# Patient Record
Sex: Male | Born: 1950 | Race: White | Hispanic: No | Marital: Married | Smoking: Former smoker
Health system: Southern US, Community
[De-identification: ages and names within clinical notes are randomized; demographics above are authoritative.]

## PROBLEM LIST (undated history)

## (undated) DIAGNOSIS — G20 Parkinson's disease: Secondary | ICD-10-CM

## (undated) DIAGNOSIS — K219 Gastro-esophageal reflux disease without esophagitis: Secondary | ICD-10-CM

## (undated) DIAGNOSIS — G4730 Sleep apnea, unspecified: Secondary | ICD-10-CM

## (undated) DIAGNOSIS — G4752 REM sleep behavior disorder: Secondary | ICD-10-CM

## (undated) DIAGNOSIS — F0390 Unspecified dementia without behavioral disturbance: Secondary | ICD-10-CM

## (undated) DIAGNOSIS — F22 Delusional disorders: Secondary | ICD-10-CM

## (undated) DIAGNOSIS — I10 Essential (primary) hypertension: Secondary | ICD-10-CM

## (undated) HISTORY — PX: KNEE ARTHROSCOPY: SUR90

---

## 1971-03-08 HISTORY — PX: MANDIBLE FRACTURE SURGERY: SHX706

## 1985-03-07 HISTORY — PX: KIDNEY STONE SURGERY: SHX686

## 1988-03-07 HISTORY — PX: BACK SURGERY: SHX140

## 1992-03-07 HISTORY — PX: CARDIAC CATHETERIZATION: SHX172

## 1992-03-07 HISTORY — PX: ELBOW SURGERY: SHX618

## 2011-03-08 DIAGNOSIS — G20 Parkinson's disease: Secondary | ICD-10-CM

## 2011-03-08 DIAGNOSIS — G20A1 Parkinson's disease without dyskinesia, without mention of fluctuations: Secondary | ICD-10-CM

## 2011-03-08 HISTORY — DX: Parkinson's disease: G20

## 2011-03-08 HISTORY — DX: Parkinson's disease without dyskinesia, without mention of fluctuations: G20.A1

## 2013-12-18 ENCOUNTER — Other Ambulatory Visit: Payer: Self-pay | Admitting: Neurosurgery

## 2013-12-18 DIAGNOSIS — M47817 Spondylosis without myelopathy or radiculopathy, lumbosacral region: Secondary | ICD-10-CM

## 2013-12-23 ENCOUNTER — Ambulatory Visit
Admission: RE | Admit: 2013-12-23 | Discharge: 2013-12-23 | Disposition: A | Discharge status: Home / Self Care | Payer: Managed Care, Other (non HMO) | Source: Ambulatory Visit | Attending: Neurosurgery | Admitting: Neurosurgery

## 2013-12-23 DIAGNOSIS — M47817 Spondylosis without myelopathy or radiculopathy, lumbosacral region: Secondary | ICD-10-CM

## 2013-12-23 MED ORDER — IOHEXOL 180 MG/ML  SOLN
15.0000 mL | Freq: Once | INTRAMUSCULAR | Status: AC | PRN
  Administered 2013-12-23: 15 mL via INTRATHECAL

## 2013-12-23 MED ORDER — DIAZEPAM 5 MG PO TABS
10.0000 mg | ORAL_TABLET | Freq: Once | ORAL | Status: AC
  Administered 2013-12-23: 10 mg via ORAL

## 2013-12-23 NOTE — Discharge Instructions (Signed)

## 2013-12-26 ENCOUNTER — Other Ambulatory Visit: Payer: Self-pay | Admitting: Neurosurgery

## 2013-12-26 ENCOUNTER — Encounter (HOSPITAL_COMMUNITY): Payer: Self-pay | Admitting: Pharmacy Technician

## 2014-01-02 ENCOUNTER — Encounter (HOSPITAL_COMMUNITY)
Admission: RE | Admit: 2014-01-02 | Discharge: 2014-01-02 | Disposition: A | Discharge status: Home / Self Care | Payer: Managed Care, Other (non HMO) | Source: Ambulatory Visit | Attending: Neurosurgery | Admitting: Neurosurgery

## 2014-01-02 ENCOUNTER — Encounter (HOSPITAL_COMMUNITY): Payer: Self-pay

## 2014-01-02 DIAGNOSIS — Z01812 Encounter for preprocedural laboratory examination: Secondary | ICD-10-CM | POA: Diagnosis present

## 2014-01-02 HISTORY — DX: Sleep apnea, unspecified: G47.30

## 2014-01-02 HISTORY — DX: Parkinson's disease: G20

## 2014-01-02 HISTORY — DX: Gastro-esophageal reflux disease without esophagitis: K21.9

## 2014-01-02 LAB — CBC WITH DIFFERENTIAL/PLATELET
BASOS ABS: 0 10*3/uL (ref 0.0–0.1)
BASOS PCT: 0 % (ref 0–1)
EOS PCT: 2 % (ref 0–5)
Eosinophils Absolute: 0.1 10*3/uL (ref 0.0–0.7)
HEMATOCRIT: 47 % (ref 39.0–52.0)
Hemoglobin: 16.1 g/dL (ref 13.0–17.0)
Lymphocytes Relative: 20 % (ref 12–46)
Lymphs Abs: 1.8 10*3/uL (ref 0.7–4.0)
MCH: 31.3 pg (ref 26.0–34.0)
MCHC: 34.3 g/dL (ref 30.0–36.0)
MCV: 91.4 fL (ref 78.0–100.0)
Monocytes Absolute: 0.8 10*3/uL (ref 0.1–1.0)
Monocytes Relative: 9 % (ref 3–12)
NEUTROS ABS: 6.3 10*3/uL (ref 1.7–7.7)
Neutrophils Relative %: 69 % (ref 43–77)
PLATELETS: 219 10*3/uL (ref 150–400)
RBC: 5.14 MIL/uL (ref 4.22–5.81)
RDW: 12.8 % (ref 11.5–15.5)
WBC: 9.1 10*3/uL (ref 4.0–10.5)

## 2014-01-02 LAB — BASIC METABOLIC PANEL
Anion gap: 14 (ref 5–15)
BUN: 11 mg/dL (ref 6–23)
CALCIUM: 9.2 mg/dL (ref 8.4–10.5)
CO2: 26 mEq/L (ref 19–32)
CREATININE: 0.7 mg/dL (ref 0.50–1.35)
Chloride: 102 mEq/L (ref 96–112)
GFR calc Af Amer: >90 mL/min (ref >90)
GFR calc non Af Amer: >90 mL/min (ref >90)
GLUCOSE: 95 mg/dL (ref 70–99)
Potassium: 4.8 mEq/L (ref 3.7–5.3)
Sodium: 142 mEq/L (ref 137–147)

## 2014-01-02 LAB — TYPE AND SCREEN
ABO/RH(D): O POS
Antibody Screen: NEGATIVE

## 2014-01-02 LAB — SURGICAL PCR SCREEN
MRSA, PCR: NEGATIVE
STAPHYLOCOCCUS AUREUS: NEGATIVE

## 2014-01-02 LAB — ABO/RH: ABO/RH(D): O POS

## 2014-01-02 NOTE — Pre-Procedure Instructions (Signed)
Tyler NatalRoger Mullet  01/02/2014   Your procedure is scheduled on:  Tuesday, Nov. 3  [facility]A" at 5:30 AM.  Call this number if you have problems the morning of surgery: [phone]                             Call this number if you have any questions prior to the surgery date: pre-admission [phone]   Remember:   Do not eat food or drink liquids after midnight.   Take these medicines the morning of surgery with A SIP OF WATER: carbidopa-levodopa, zyrtec, clonazepam,    Do not wear jewelry, make-up or nail polish.  Do not wear lotions, powders, or perfumes. You may wear deodorant.  Do not shave 48 hours prior to surgery. Men may shave face and neck.  Do not bring valuables to the hospital.  [facility] is not responsible                  for any belongings or valuables.               Contacts, dentures or bridgework may not be worn into surgery.  Leave suitcase in the car. After surgery it may be brought to your room.  For patients admitted to the hospital, discharge time is determined by your                treatment team.               Patients discharged the day of surgery will not be allowed to drive  home.  Name and phone number of your driver:   Special Instructions: review preparing for surgery handout   Please read over the following fact sheets that you were given: Pain Booklet, Coughing and Deep Breathing, Blood Transfusion Information, MRSA Information and Surgical Site Infection Prevention

## 2014-01-02 NOTE — Progress Notes (Signed)
PCP: Dr[facility] Adon[facility]Michael Waters [facility] in [place] Dr. Kern ReapKimberly Bird in [place] - sleep apnea  No cardiologist. States 1994 [facility] for "chest pain" states it was normal and no problems since then. Doesn't remember who refered him. Pt. Did state he saw Dr. Cindi CarbonZachary Bosh in [place] (cardiologist) and may had a stress test 4 yrs. Ago doesn't remember why and hasn't seen him since that time. Will request these records.

## 2014-01-06 MED ORDER — CEFAZOLIN SODIUM-DEXTROSE 2-3 GM-% IV SOLR
2.0000 g | INTRAVENOUS | Status: AC
  Administered 2014-01-07: 2 g via INTRAVENOUS
  Filled 2014-01-06: qty 50

## 2014-01-06 MED ORDER — DEXAMETHASONE SODIUM PHOSPHATE 10 MG/ML IJ SOLN
10.0000 mg | INTRAMUSCULAR | Status: AC
  Administered 2014-01-07: 10 mg via INTRAVENOUS
  Filled 2014-01-06: qty 1

## 2014-01-06 NOTE — Anesthesia Preprocedure Evaluation (Addendum)
Anesthesia Evaluation  Patient identified by MRN, date of birth, ID band Patient awake    Reviewed: Allergy & Precautions, H&P , NPO status , Patient's Chart, lab work & pertinent test results  Airway Mallampati: III   Neck ROM: Full    Dental  (+) Teeth Intact   Pulmonary sleep apnea , former smoker,          Cardiovascular Rhythm:Regular     Neuro/Psych    GI/Hepatic GERD-  ,  Endo/Other    Renal/GU      Musculoskeletal   Abdominal (+)  Abdomen: soft.    Peds  Hematology   Anesthesia Other Findings   Reproductive/Obstetrics                            Anesthesia Physical Anesthesia Plan  ASA: III  Anesthesia Plan: General   Post-op Pain Management:    Induction: Intravenous  Airway Management Planned: Oral ETT  Additional Equipment:   Intra-op Plan:   Post-operative Plan: Extubation in OR  Informed Consent: I have reviewed the patients History and Physical, chart, labs and discussed the procedure including the risks, benefits and alternatives for the proposed anesthesia with the patient or authorized representative who has indicated his/her understanding and acceptance.     Plan Discussed with:   Anesthesia Plan Comments:         Anesthesia Quick Evaluation

## 2014-01-07 ENCOUNTER — Inpatient Hospital Stay (HOSPITAL_COMMUNITY)
Admission: RE | Admit: 2014-01-07 | Discharge: 2014-01-08 | DRG: 460 | Disposition: A | Discharge status: Home / Self Care | Payer: Managed Care, Other (non HMO) | Source: Ambulatory Visit | Attending: Neurosurgery | Admitting: Neurosurgery

## 2014-01-07 ENCOUNTER — Encounter (HOSPITAL_COMMUNITY): Payer: Self-pay | Admitting: *Deleted

## 2014-01-07 ENCOUNTER — Inpatient Hospital Stay (HOSPITAL_COMMUNITY): Payer: Managed Care, Other (non HMO) | Admitting: Anesthesiology

## 2014-01-07 ENCOUNTER — Inpatient Hospital Stay (HOSPITAL_COMMUNITY): Payer: Managed Care, Other (non HMO)

## 2014-01-07 ENCOUNTER — Encounter (HOSPITAL_COMMUNITY)
Admission: RE | Disposition: A | Discharge status: Home / Self Care | Payer: Managed Care, Other (non HMO) | Source: Ambulatory Visit | Attending: Neurosurgery

## 2014-01-07 DIAGNOSIS — M4317 Spondylolisthesis, lumbosacral region: Secondary | ICD-10-CM | POA: Diagnosis present

## 2014-01-07 DIAGNOSIS — F17220 Nicotine dependence, chewing tobacco, uncomplicated: Secondary | ICD-10-CM | POA: Diagnosis present

## 2014-01-07 DIAGNOSIS — M4310 Spondylolisthesis, site unspecified: Secondary | ICD-10-CM

## 2014-01-07 DIAGNOSIS — G4752 REM sleep behavior disorder: Secondary | ICD-10-CM | POA: Diagnosis present

## 2014-01-07 DIAGNOSIS — K219 Gastro-esophageal reflux disease without esophagitis: Secondary | ICD-10-CM | POA: Diagnosis present

## 2014-01-07 DIAGNOSIS — G20 Parkinson's disease: Secondary | ICD-10-CM | POA: Diagnosis present

## 2014-01-07 DIAGNOSIS — M79604 Pain in right leg: Secondary | ICD-10-CM | POA: Diagnosis present

## 2014-01-07 DIAGNOSIS — G4730 Sleep apnea, unspecified: Secondary | ICD-10-CM | POA: Diagnosis present

## 2014-01-07 DIAGNOSIS — Z7952 Long term (current) use of systemic steroids: Secondary | ICD-10-CM | POA: Diagnosis not present

## 2014-01-07 HISTORY — DX: REM sleep behavior disorder: G47.52

## 2014-01-07 SURGERY — POSTERIOR LUMBAR FUSION 1 LEVEL
Anesthesia: General | Site: Back

## 2014-01-07 MED ORDER — OXYCODONE-ACETAMINOPHEN 5-325 MG PO TABS
1.0000 | ORAL_TABLET | ORAL | Status: DC | PRN
  Administered 2014-01-07 – 2014-01-08 (×5): 2 via ORAL
  Filled 2014-01-07 (×4): qty 2

## 2014-01-07 MED ORDER — ROCURONIUM BROMIDE 50 MG/5ML IV SOLN
INTRAVENOUS | Status: AC
  Filled 2014-01-07: qty 1

## 2014-01-07 MED ORDER — CARBIDOPA-LEVODOPA 25-100 MG PO TABS
2.0000 | ORAL_TABLET | Freq: Three times a day (TID) | ORAL | Status: DC
  Administered 2014-01-07: 2 via ORAL
  Filled 2014-01-07 (×5): qty 2

## 2014-01-07 MED ORDER — HYDROMORPHONE HCL 1 MG/ML IJ SOLN
0.5000 mg | INTRAMUSCULAR | Status: DC | PRN
  Administered 2014-01-07: 1 mg via INTRAVENOUS
  Filled 2014-01-07: qty 1

## 2014-01-07 MED ORDER — ALUM & MAG HYDROXIDE-SIMETH 200-200-20 MG/5ML PO SUSP: 30.0000 mL | Freq: Four times a day (QID) | ORAL | Status: DC | PRN

## 2014-01-07 MED ORDER — LINACLOTIDE 145 MCG PO CAPS
145.0000 ug | ORAL_CAPSULE | Freq: Every day | ORAL | Status: DC
  Filled 2014-01-07 (×2): qty 1

## 2014-01-07 MED ORDER — MENTHOL 3 MG MT LOZG: 1.0000 | LOZENGE | OROMUCOSAL | Status: DC | PRN

## 2014-01-07 MED ORDER — BISACODYL 10 MG RE SUPP: 10.0000 mg | Freq: Every day | RECTAL | Status: DC | PRN

## 2014-01-07 MED ORDER — ONDANSETRON HCL 4 MG/2ML IJ SOLN: 4.0000 mg | INTRAMUSCULAR | Status: DC | PRN

## 2014-01-07 MED ORDER — GLYCOPYRROLATE 0.2 MG/ML IJ SOLN
INTRAMUSCULAR | Status: AC
  Filled 2014-01-07: qty 3

## 2014-01-07 MED ORDER — PROMETHAZINE HCL 25 MG/ML IJ SOLN: 6.2500 mg | INTRAMUSCULAR | Status: DC | PRN

## 2014-01-07 MED ORDER — SUCCINYLCHOLINE CHLORIDE 20 MG/ML IJ SOLN
INTRAMUSCULAR | Status: AC
  Filled 2014-01-07: qty 1

## 2014-01-07 MED ORDER — ACETAMINOPHEN 650 MG RE SUPP: 650.0000 mg | RECTAL | Status: DC | PRN

## 2014-01-07 MED ORDER — DEXMEDETOMIDINE HCL 200 MCG/2ML IV SOLN
INTRAVENOUS | Status: DC | PRN
  Administered 2014-01-07: 4 ug via INTRAVENOUS
  Administered 2014-01-07 (×2): 8 ug via INTRAVENOUS

## 2014-01-07 MED ORDER — CLONAZEPAM 0.5 MG PO TABS
0.5000 mg | ORAL_TABLET | Freq: Two times a day (BID) | ORAL | Status: DC
  Administered 2014-01-07 (×2): 0.5 mg via ORAL
  Filled 2014-01-07 (×2): qty 1

## 2014-01-07 MED ORDER — SODIUM CHLORIDE 0.9 % IR SOLN
Status: DC | PRN
  Administered 2014-01-07: 08:00:00

## 2014-01-07 MED ORDER — OXYCODONE-ACETAMINOPHEN 5-325 MG PO TABS
ORAL_TABLET | ORAL | Status: AC
  Filled 2014-01-07: qty 2

## 2014-01-07 MED ORDER — FENTANYL CITRATE 0.05 MG/ML IJ SOLN
25.0000 ug | INTRAMUSCULAR | Status: DC | PRN
  Administered 2014-01-07 (×3): 50 ug via INTRAVENOUS

## 2014-01-07 MED ORDER — FENTANYL CITRATE 0.05 MG/ML IJ SOLN
INTRAMUSCULAR | Status: AC
  Filled 2014-01-07: qty 2

## 2014-01-07 MED ORDER — DEXTROSE 5 % IV SOLN
10.0000 mg | INTRAVENOUS | Status: DC | PRN
  Administered 2014-01-07: 20 ug/min via INTRAVENOUS

## 2014-01-07 MED ORDER — ARTIFICIAL TEARS OP OINT
TOPICAL_OINTMENT | OPHTHALMIC | Status: DC | PRN
  Administered 2014-01-07: 1 via OPHTHALMIC

## 2014-01-07 MED ORDER — NEOSTIGMINE METHYLSULFATE 10 MG/10ML IV SOLN
INTRAVENOUS | Status: DC | PRN
  Administered 2014-01-07: 4 mg via INTRAVENOUS

## 2014-01-07 MED ORDER — VANCOMYCIN HCL 1000 MG IV SOLR
INTRAVENOUS | Status: AC
  Filled 2014-01-07: qty 1000

## 2014-01-07 MED ORDER — ARTIFICIAL TEARS OP OINT
TOPICAL_OINTMENT | OPHTHALMIC | Status: AC
  Filled 2014-01-07: qty 3.5

## 2014-01-07 MED ORDER — PHENYLEPHRINE HCL 10 MG/ML IJ SOLN
INTRAMUSCULAR | Status: DC | PRN
  Administered 2014-01-07 (×2): 80 ug via INTRAVENOUS

## 2014-01-07 MED ORDER — EPHEDRINE SULFATE 50 MG/ML IJ SOLN
INTRAMUSCULAR | Status: AC
  Filled 2014-01-07: qty 1

## 2014-01-07 MED ORDER — BUPIVACAINE HCL (PF) 0.25 % IJ SOLN
INTRAMUSCULAR | Status: DC | PRN
  Administered 2014-01-07: 20 mL

## 2014-01-07 MED ORDER — CYCLOBENZAPRINE HCL 10 MG PO TABS
ORAL_TABLET | ORAL | Status: AC
  Filled 2014-01-07: qty 1

## 2014-01-07 MED ORDER — MIDAZOLAM HCL 5 MG/5ML IJ SOLN
INTRAMUSCULAR | Status: DC | PRN
  Administered 2014-01-07: 2 mg via INTRAVENOUS

## 2014-01-07 MED ORDER — LORATADINE 10 MG PO TABS
10.0000 mg | ORAL_TABLET | Freq: Every day | ORAL | Status: DC
  Administered 2014-01-08: 10 mg via ORAL
  Filled 2014-01-07: qty 1

## 2014-01-07 MED ORDER — SODIUM CHLORIDE 0.9 % IJ SOLN
INTRAMUSCULAR | Status: AC
  Filled 2014-01-07: qty 10

## 2014-01-07 MED ORDER — 0.9 % SODIUM CHLORIDE (POUR BTL) OPTIME
TOPICAL | Status: DC | PRN
  Administered 2014-01-07: 1000 mL

## 2014-01-07 MED ORDER — THROMBIN 20000 UNITS EX SOLR
CUTANEOUS | Status: DC | PRN
  Administered 2014-01-07: 08:00:00 via TOPICAL

## 2014-01-07 MED ORDER — ROCURONIUM BROMIDE 100 MG/10ML IV SOLN
INTRAVENOUS | Status: DC | PRN
  Administered 2014-01-07: 10 mg via INTRAVENOUS
  Administered 2014-01-07: 50 mg via INTRAVENOUS

## 2014-01-07 MED ORDER — PANTOPRAZOLE SODIUM 40 MG PO TBEC
40.0000 mg | DELAYED_RELEASE_TABLET | Freq: Every day | ORAL | Status: DC
  Administered 2014-01-07: 40 mg via ORAL
  Filled 2014-01-07 (×2): qty 1

## 2014-01-07 MED ORDER — LIDOCAINE HCL (CARDIAC) 20 MG/ML IV SOLN
INTRAVENOUS | Status: DC | PRN
  Administered 2014-01-07: 80 mg via INTRAVENOUS

## 2014-01-07 MED ORDER — NEOSTIGMINE METHYLSULFATE 10 MG/10ML IV SOLN
INTRAVENOUS | Status: AC
  Filled 2014-01-07: qty 1

## 2014-01-07 MED ORDER — PROPOFOL 10 MG/ML IV BOLUS
INTRAVENOUS | Status: AC
  Filled 2014-01-07: qty 20

## 2014-01-07 MED ORDER — ONDANSETRON HCL 4 MG/2ML IJ SOLN
INTRAMUSCULAR | Status: DC | PRN
  Administered 2014-01-07: 4 mg via INTRAVENOUS

## 2014-01-07 MED ORDER — ONDANSETRON HCL 4 MG/2ML IJ SOLN
INTRAMUSCULAR | Status: AC
  Filled 2014-01-07: qty 2

## 2014-01-07 MED ORDER — MEPERIDINE HCL 25 MG/ML IJ SOLN: 6.2500 mg | INTRAMUSCULAR | Status: DC | PRN

## 2014-01-07 MED ORDER — GLYCOPYRROLATE 0.2 MG/ML IJ SOLN
INTRAMUSCULAR | Status: DC | PRN
  Administered 2014-01-07: 0.6 mg via INTRAVENOUS

## 2014-01-07 MED ORDER — FENTANYL CITRATE 0.05 MG/ML IJ SOLN
INTRAMUSCULAR | Status: AC
  Filled 2014-01-07: qty 5

## 2014-01-07 MED ORDER — PROPOFOL 10 MG/ML IV BOLUS
INTRAVENOUS | Status: DC | PRN
  Administered 2014-01-07: 160 mg via INTRAVENOUS

## 2014-01-07 MED ORDER — CYCLOBENZAPRINE HCL 10 MG PO TABS
10.0000 mg | ORAL_TABLET | Freq: Three times a day (TID) | ORAL | Status: DC | PRN
  Administered 2014-01-07: 10 mg via ORAL

## 2014-01-07 MED ORDER — VANCOMYCIN HCL 1000 MG IV SOLR
INTRAVENOUS | Status: DC | PRN
  Administered 2014-01-07: 1000 mg

## 2014-01-07 MED ORDER — HYDROCODONE-ACETAMINOPHEN 5-325 MG PO TABS: 1.0000 | ORAL_TABLET | ORAL | Status: DC | PRN

## 2014-01-07 MED ORDER — PHENYLEPHRINE 40 MCG/ML (10ML) SYRINGE FOR IV PUSH (FOR BLOOD PRESSURE SUPPORT)
PREFILLED_SYRINGE | INTRAVENOUS | Status: AC
  Filled 2014-01-07: qty 10

## 2014-01-07 MED ORDER — FLEET ENEMA 7-19 GM/118ML RE ENEM: 1.0000 | ENEMA | Freq: Once | RECTAL | Status: AC | PRN

## 2014-01-07 MED ORDER — FENTANYL CITRATE 0.05 MG/ML IJ SOLN
INTRAMUSCULAR | Status: DC | PRN
  Administered 2014-01-07 (×2): 50 ug via INTRAVENOUS
  Administered 2014-01-07: 100 ug via INTRAVENOUS

## 2014-01-07 MED ORDER — PHENOL 1.4 % MT LIQD: 1.0000 | OROMUCOSAL | Status: DC | PRN

## 2014-01-07 MED ORDER — SODIUM CHLORIDE 0.9 % IJ SOLN: 3.0000 mL | INTRAMUSCULAR | Status: DC | PRN

## 2014-01-07 MED ORDER — CELECOXIB 200 MG PO CAPS
200.0000 mg | ORAL_CAPSULE | Freq: Every day | ORAL | Status: DC
  Administered 2014-01-07 – 2014-01-08 (×2): 200 mg via ORAL
  Filled 2014-01-07 (×2): qty 1

## 2014-01-07 MED ORDER — CEFAZOLIN SODIUM 1-5 GM-% IV SOLN
1.0000 g | Freq: Three times a day (TID) | INTRAVENOUS | Status: AC
  Administered 2014-01-07 (×2): 1 g via INTRAVENOUS
  Filled 2014-01-07 (×2): qty 50

## 2014-01-07 MED ORDER — MIDAZOLAM HCL 2 MG/2ML IJ SOLN
INTRAMUSCULAR | Status: AC
  Filled 2014-01-07: qty 2

## 2014-01-07 MED ORDER — SENNA 8.6 MG PO TABS
1.0000 | ORAL_TABLET | Freq: Two times a day (BID) | ORAL | Status: DC
  Administered 2014-01-07 (×2): 8.6 mg via ORAL
  Filled 2014-01-07 (×4): qty 1

## 2014-01-07 MED ORDER — ACETAMINOPHEN 325 MG PO TABS
650.0000 mg | ORAL_TABLET | ORAL | Status: DC | PRN
Start: 2014-01-07 — End: 2014-01-08

## 2014-01-07 MED ORDER — SODIUM CHLORIDE 0.9 % IJ SOLN
3.0000 mL | Freq: Two times a day (BID) | INTRAMUSCULAR | Status: DC
  Administered 2014-01-07: 3 mL via INTRAVENOUS

## 2014-01-07 MED ORDER — PREDNISONE 2.5 MG PO TABS
2.5000 mg | ORAL_TABLET | Freq: Every day | ORAL | Status: DC
Start: 2014-01-08 — End: 2014-01-08
  Administered 2014-01-08: 2.5 mg via ORAL
  Filled 2014-01-07 (×2): qty 1

## 2014-01-07 MED ORDER — SUCCINYLCHOLINE CHLORIDE 20 MG/ML IJ SOLN
INTRAMUSCULAR | Status: DC | PRN
  Administered 2014-01-07: 140 mg via INTRAVENOUS

## 2014-01-07 MED ORDER — SODIUM CHLORIDE 0.9 % IV SOLN: 250.0000 mL | INTRAVENOUS | Status: DC

## 2014-01-07 MED ORDER — POLYETHYLENE GLYCOL 3350 17 G PO PACK: 17.0000 g | PACK | Freq: Every day | ORAL | Status: DC | PRN

## 2014-01-07 MED ORDER — LIDOCAINE HCL (CARDIAC) 20 MG/ML IV SOLN
INTRAVENOUS | Status: AC
  Filled 2014-01-07: qty 5

## 2014-01-07 MED ORDER — LACTATED RINGERS IV SOLN
INTRAVENOUS | Status: DC | PRN
  Administered 2014-01-07 (×2): via INTRAVENOUS

## 2014-01-07 MED FILL — Heparin Sodium (Porcine) Inj 1000 Unit/ML: INTRAMUSCULAR | Qty: 30 | Status: AC

## 2014-01-07 MED FILL — Sodium Chloride IV Soln 0.9%: INTRAVENOUS | Qty: 1000 | Status: AC

## 2014-01-07 SURGICAL SUPPLY — 64 items
BAG DECANTER FOR FLEXI CONT (MISCELLANEOUS) ×2 IMPLANT
BENZOIN TINCTURE PRP APPL 2/3 (GAUZE/BANDAGES/DRESSINGS) ×2 IMPLANT
BLADE CLIPPER SURG (BLADE) IMPLANT
BRUSH SCRUB EZ PLAIN DRY (MISCELLANEOUS) ×2 IMPLANT
BUR MATCHSTICK NEURO 3.0 LAGG (BURR) ×2 IMPLANT
CAGE CAPSTONE 10X22X6 (Cage) ×4 IMPLANT
CANISTER SUCT 3000ML (MISCELLANEOUS) ×2 IMPLANT
CAP LCK SPNE (Orthopedic Implant) ×4 IMPLANT
CAP LOCK SPINE RADIUS (Orthopedic Implant) ×4 IMPLANT
CAP LOCKING (Orthopedic Implant) ×4 IMPLANT
CONT SPEC 4OZ CLIKSEAL STRL BL (MISCELLANEOUS) ×4 IMPLANT
COVER BACK TABLE 60X90IN (DRAPES) ×2 IMPLANT
DECANTER SPIKE VIAL GLASS SM (MISCELLANEOUS) ×2 IMPLANT
DERMABOND ADHESIVE PROPEN (GAUZE/BANDAGES/DRESSINGS) ×1
DERMABOND ADVANCED .7 DNX6 (GAUZE/BANDAGES/DRESSINGS) ×1 IMPLANT
DRAPE C-ARM 42X72 X-RAY (DRAPES) ×4 IMPLANT
DRAPE LAPAROTOMY 100X72X124 (DRAPES) ×2 IMPLANT
DRAPE POUCH INSTRU U-SHP 10X18 (DRAPES) ×2 IMPLANT
DRAPE PROXIMA HALF (DRAPES) IMPLANT
DRAPE SURG 17X23 STRL (DRAPES) ×8 IMPLANT
DRSG OPSITE POSTOP 4X6 (GAUZE/BANDAGES/DRESSINGS) ×2 IMPLANT
DURAPREP 26ML APPLICATOR (WOUND CARE) ×2 IMPLANT
ELECT REM PT RETURN 9FT ADLT (ELECTROSURGICAL) ×2
ELECTRODE REM PT RTRN 9FT ADLT (ELECTROSURGICAL) ×1 IMPLANT
EVACUATOR 1/8 PVC DRAIN (DRAIN) ×2 IMPLANT
GAUZE SPONGE 4X4 12PLY STRL (GAUZE/BANDAGES/DRESSINGS) ×2 IMPLANT
GAUZE SPONGE 4X4 16PLY XRAY LF (GAUZE/BANDAGES/DRESSINGS) IMPLANT
GLOVE BIOGEL PI IND STRL 7.0 (GLOVE) ×2 IMPLANT
GLOVE BIOGEL PI IND STRL 7.5 (GLOVE) ×1 IMPLANT
GLOVE BIOGEL PI INDICATOR 7.0 (GLOVE) ×2
GLOVE BIOGEL PI INDICATOR 7.5 (GLOVE) ×1
GLOVE ECLIPSE 7.0 STRL STRAW (GLOVE) ×2 IMPLANT
GLOVE ECLIPSE 9.0 STRL (GLOVE) ×4 IMPLANT
GLOVE EXAM NITRILE LRG STRL (GLOVE) IMPLANT
GLOVE EXAM NITRILE MD LF STRL (GLOVE) IMPLANT
GLOVE EXAM NITRILE XL STR (GLOVE) IMPLANT
GLOVE EXAM NITRILE XS STR PU (GLOVE) IMPLANT
GLOVE OPTIFIT SS 6.5 STRL BRWN (GLOVE) ×8 IMPLANT
GOWN STRL REUS W/ TWL LRG LVL3 (GOWN DISPOSABLE) ×2 IMPLANT
GOWN STRL REUS W/ TWL XL LVL3 (GOWN DISPOSABLE) ×2 IMPLANT
GOWN STRL REUS W/TWL 2XL LVL3 (GOWN DISPOSABLE) IMPLANT
GOWN STRL REUS W/TWL LRG LVL3 (GOWN DISPOSABLE) ×2
GOWN STRL REUS W/TWL XL LVL3 (GOWN DISPOSABLE) ×2
KIT BASIN OR (CUSTOM PROCEDURE TRAY) ×2 IMPLANT
KIT ROOM TURNOVER OR (KITS) ×2 IMPLANT
LIQUID BAND (GAUZE/BANDAGES/DRESSINGS) ×2 IMPLANT
NEEDLE HYPO 22GX1.5 SAFETY (NEEDLE) ×2 IMPLANT
NS IRRIG 1000ML POUR BTL (IV SOLUTION) ×2 IMPLANT
PACK LAMINECTOMY NEURO (CUSTOM PROCEDURE TRAY) ×2 IMPLANT
ROD RADIUS 35MM (Rod) ×4 IMPLANT
SCREW 6.75X40MM (Screw) ×8 IMPLANT
SPONGE SURGIFOAM ABS GEL 100 (HEMOSTASIS) ×2 IMPLANT
STRIP CLOSURE SKIN 1/2X4 (GAUZE/BANDAGES/DRESSINGS) ×4 IMPLANT
SUT VIC AB 0 CT1 18XCR BRD8 (SUTURE) ×2 IMPLANT
SUT VIC AB 0 CT1 8-18 (SUTURE) ×2
SUT VIC AB 2-0 CT1 18 (SUTURE) ×2 IMPLANT
SUT VIC AB 3-0 SH 8-18 (SUTURE) ×4 IMPLANT
SYR 20ML ECCENTRIC (SYRINGE) ×2 IMPLANT
TAPE CLOTH SURG 4X10 WHT LF (GAUZE/BANDAGES/DRESSINGS) ×2 IMPLANT
TAPE STRIPS DRAPE STRL (GAUZE/BANDAGES/DRESSINGS) ×2 IMPLANT
TOWEL OR 17X24 6PK STRL BLUE (TOWEL DISPOSABLE) ×2 IMPLANT
TOWEL OR 17X26 10 PK STRL BLUE (TOWEL DISPOSABLE) ×2 IMPLANT
TRAY FOLEY CATH 14FRSI W/METER (CATHETERS) ×2 IMPLANT
WATER STERILE IRR 1000ML POUR (IV SOLUTION) ×2 IMPLANT

## 2014-01-07 NOTE — Progress Notes (Signed)
Utilization review completed.  

## 2014-01-07 NOTE — H&P (Signed)
Tyler Fitzpatrick is an 63 y.o. male.   Chief Complaint: back and right leg pain HPI: 63 year old male with chronic progressive lower back pain with radiation into his right lower extremity failing conservative management. Workup demonstrates evidence of Fitzpatrick grade 1 L5-S1 lytic spondylolisthesis with severe foraminal stenosis right greater than left. Patient has failed conservative management presents now for decompression and fusion.  Past Medical History  Diagnosis Date  . GERD (gastroesophageal reflux disease)   . Parkinson disease 2013  . Sleep apnea     uses CPAP  . 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 Back surgery  1990  . Mandible fracture surgery  1973  . Elbow surgery Left 1994  . Knee arthroscopy Right   . Kidney stone surgery  1987  . Cardiac catheterization  1994    History reviewed. No pertinent family history. Social History:  reports that he has quit smoking. His smoking use included Cigarettes. He smoked 0.00 packs per day. His smokeless tobacco use includes Chew. He reports that he does not drink alcohol or use illicit drugs.  Allergies: No Known Allergies  Medications Prior to Admission  Medication Sig Dispense Refill  . carbidopa-levodopa (SINEMET IR) 25-100 MG per tablet Take 2 tablets by mouth 3 (three) times daily.    . celecoxib (CELEBREX) 200 MG capsule Take 200 mg by mouth daily.    . cetirizine (ZYRTEC) 10 MG tablet Take 10 mg by mouth daily.    . clonazePAM (KLONOPIN) 0.5 MG tablet Take 0.5 mg by mouth 2 (two) times daily.    . Linaclotide (LINZESS) 145 MCG CAPS capsule Take 145 mcg by mouth daily.    Marland Kitchen. omeprazole (PRILOSEC) 20 MG capsule Take 20 mg by mouth daily.    . predniSONE (DELTASONE) 2.5 MG tablet Take 2.5 mg by mouth daily with breakfast.      No results found for this or any previous visit (from the past 48 hour(s)). No results found.  Review of Systems  Constitutional:  Negative.   HENT: Negative.   Eyes: Negative.   Respiratory: Negative.   Cardiovascular: Negative.   Gastrointestinal: Negative.   Genitourinary: Negative.   Musculoskeletal: Negative.   Skin: Negative.   Neurological: Negative.   Endo/Heme/Allergies: Negative.   Psychiatric/Behavioral: Negative.     Blood pressure 168/71, pulse 81, temperature 98.4 F (36.9 C), temperature source Oral, resp. rate 18, weight 103.93 kg (229 lb 2 oz), SpO2 97 %. Physical Exam  Constitutional: He is oriented to person, place, and time. He appears well-developed and well-nourished. No distress.  HENT:  Head: Normocephalic and atraumatic.  Right Ear: External ear normal.  Left Ear: External ear normal.  Nose: Nose normal.  Mouth/Throat: Oropharynx is clear and moist. No oropharyngeal exudate.  Eyes: Conjunctivae and EOM are normal. Pupils are equal, round, and reactive to light. Right eye exhibits no discharge. Left eye exhibits no discharge.  Neck: Normal range of motion. Neck supple. No tracheal deviation present. No thyromegaly present.  Cardiovascular: Normal rate, regular rhythm, normal heart sounds and intact distal pulses.  Exam reveals no friction rub.   No murmur heard. Respiratory: Effort normal and breath sounds normal. No respiratory distress. He has no wheezes.  GI: Soft. Bowel sounds are normal. He exhibits no distension. There is no tenderness.  Musculoskeletal: Normal range of motion. He exhibits no edema or tenderness.  Neurological: He is alert and oriented  to person, place, and time. He has normal reflexes. He displays normal reflexes. No cranial nerve deficit. He exhibits normal muscle tone. Coordination normal.  Skin: Skin is warm and dry. No rash noted. He is not diaphoretic. No erythema. No pallor.  Psychiatric: He has Fitzpatrick normal mood and affect. His behavior is normal. Judgment and thought content normal.     Assessment/Plan Grade 1 L5-S1 lytic spondylolisthesis with foraminal  stenosis and radiculopathy. Plan L5-S1 Gill procedure followed by posterior lumbar interbody fusion utilizing interbody peek cage, locally harvested autograft and coupled with posterior lateral arthrodesis utilizing nonsegmental pedicle screw is mentation. Risks and benefits of been explained. Patient wishes to proceed.  Tyler Fitzpatrick 01/07/2014, 7:42 AM

## 2014-01-07 NOTE — Brief Op Note (Signed)
01/07/2014  9:52 AM  PATIENT:  Debbora Prestooger A Korinek  63 y.o. male  PRE-OPERATIVE DIAGNOSIS:  spondylolisthesis  POST-OPERATIVE DIAGNOSIS:  spondylolisthesis  PROCEDURE:  Procedure(s) with comments: POSTERIOR LUMBAR FUSION 1 LEVEL (N/A) - POSTERIOR LUMBAR FUSION 1 LEVEL L5-S1  SURGEON:  Surgeon(s) and Role:    * Temple PaciniHenry A Gurneet Matarese, MD - Primary    * Lisbeth RenshawNeelesh Nundkumar, MD - Assisting  PHYSICIAN ASSISTANT:   ASSISTANTS:    ANESTHESIA:   general  EBL:  Total I/O In: 1100 [I.V.:1100] Out: 600 [Urine:400; Blood:200]  BLOOD ADMINISTERED:none  DRAINS: none   LOCAL MEDICATIONS USED:  MARCAINE     SPECIMEN:  No Specimen  DISPOSITION OF SPECIMEN:  N/A  COUNTS:  YES  TOURNIQUET:  * No tourniquets in log *  DICTATION: .Dragon Dictation  PLAN OF CARE: Admit to inpatient   PATIENT DISPOSITION:  PACU - hemodynamically stable.   Delay start of Pharmacological VTE agent (>24hrs) due to surgical blood loss or risk of bleeding: yes

## 2014-01-07 NOTE — Anesthesia Postprocedure Evaluation (Signed)
  Anesthesia Post-op Note  Patient: Tyler Fitzpatrick  Procedure(s) Performed: Procedure(s) with comments: POSTERIOR LUMBAR FUSION 1 LEVEL (N/A) - POSTERIOR LUMBAR FUSION 1 LEVEL L5-S1  Patient Location: PACU  Anesthesia Type:General  Level of Consciousness: awake and alert   Airway and Oxygen Therapy: Patient Spontanous Breathing and Patient connected to nasal cannula oxygen  Post-op Pain: none  Post-op Assessment: Post-op Vital signs reviewed, PATIENT'S CARDIOVASCULAR STATUS UNSTABLE, Respiratory Function Stable and Patent Airway  Post-op Vital Signs: Reviewed and stable  Last Vitals:  Filed Vitals:   01/07/14 1020  BP: 152/84  Pulse: 90  Temp:   Resp: 19    Complications: No apparent anesthesia complications

## 2014-01-07 NOTE — Anesthesia Procedure Notes (Signed)
Procedure Name: Intubation Date/Time: 01/07/2014 7:49 AM Performed by: Elon AlasLEE, Ardyth Kelso BROWN Pre-anesthesia Checklist: Patient identified, Timeout performed, Emergency Drugs available, Suction available and Patient being monitored Patient Re-evaluated:Patient Re-evaluated prior to inductionOxygen Delivery Method: Circle system utilized Preoxygenation: Pre-oxygenation with 100% oxygen Intubation Type: IV induction Ventilation: Mask ventilation with difficulty Laryngoscope Size: Mac and 4 Grade View: Grade IV Tube type: Oral Tube size: 8.0 mm Number of attempts: 1 Airway Equipment and Method: Stylet Placement Confirmation: positive ETCO2,  CO2 detector,  ETT inserted through vocal cords under direct vision and breath sounds checked- equal and bilateral Secured at: 24 cm Tube secured with: Tape Dental Injury: Teeth and Oropharynx as per pre-operative assessment  Future Recommendations: Recommend- induction with short-acting agent, and alternative techniques readily available

## 2014-01-07 NOTE — Evaluation (Addendum)
Occupational Therapy Evaluation Patient Details Name: Tyler PrestoRoger A Fitzpatrick MRN: [id number] DOB: 12/22/1950 Today's Date: 01/07/2014    History of Present Illness 63 y.o. s/p POSTERIOR LUMBAR FUSION 1 LEVEL L5-S1.   Clinical Impression   Pt s/p above. Pt independent with ADLs, PTA. Feel pt will benefit from acute OT to increase independence prior to d/c. Mobility limited by dizziness (ambulated short distance in room). Education provided to pt and spouse. Plan to practice ADLs next session and reinforce precautions.     Follow Up Recommendations  No OT follow up;Supervision - Intermittent    Equipment Recommendations  3 in 1 bedside comode;None recommended by OT (AE)    Recommendations for Other Services       Precautions / Restrictions Precautions Precautions: Back;Fall Precaution Booklet Issued: Yes (comment) Precaution Comments: educated on back precautions Required Braces or Orthoses: Spinal Brace Spinal Brace: Lumbar corset;Applied in sitting position Restrictions Weight Bearing Restrictions: No      Mobility Bed Mobility Overal bed mobility: Needs Assistance Bed Mobility: Rolling;Sidelying to Sit;Sit to Sidelying Rolling: Min guard Sidelying to sit: Mod assist     Sit to sidelying: Min guard General bed mobility comments: cues for log roll technique. Assist with trunk to come to sitting position.  Transfers Overall transfer level: Needs assistance   Transfers: Sit to/from Stand Sit to Stand: Min assist         General transfer comment: assist to boost. Cues for technique.    Balance                                            ADL Overall ADL's : Needs assistance/impaired                 Upper Body Dressing : Minimal assistance;Sitting   Lower Body Dressing: Minimal assistance;With adaptive equipment;Sit to/from stand   Toilet Transfer: Minimal assistance;Ambulation (bed)   Toileting- Clothing Manipulation and Hygiene: Moderate  assistance;Sit to/from stand       Functional mobility during ADLs: Minimal assistance (used IV pole ) General ADL Comments: Educated on use of cup for teeth care and placement of grooming items to avoid breaking precautions. Educated on AE including what pt can use for toilet aide for hygiene. Instructed to have clothing under brace and no sleeping in back brace. Wife assisted in donning brace. Educated on safety (rugs, safe shoewear, recommended wife be with him for shower transfer-pt says he is planning to sponge bathe). Practiced donning/doffing socks with reacher/sockaid.     Vision                     Perception     Praxis      Pertinent Vitals/Pain Pain Assessment: 0-10 Pain Score: 9  Pain Location: back Pain Intervention(s): Monitored during session;Repositioned     Hand Dominance Right   Extremity/Trunk Assessment Upper Extremity Assessment Upper Extremity Assessment: Overall WFL for tasks assessed   Lower Extremity Assessment Lower Extremity Assessment: Defer to PT evaluation       Communication Communication Communication: No difficulties   Cognition Arousal/Alertness: Awake/alert Behavior During Therapy: WFL for tasks assessed/performed Overall Cognitive Status: Within Functional Limits for tasks assessed                     General Comments       Exercises  Shoulder Instructions      Home Living Family/patient expects to be discharged to:: Private residence Living Arrangements: Spouse/significant other Available Help at Discharge: Family;Available 24 hours/day Type of Home: House Home Access: Stairs to enter Entergy CorporationEntrance Stairs-Number of Steps: 1 Entrance Stairs-Rails: None Home Layout: One level;Other (Comment) (with basement)     Bathroom Shower/Tub: Tub/shower unit;Walk-in shower   Bathroom Toilet: Standard     Home Equipment: Cane - single point;Other (comment) (access to walker)          Prior  Functioning/Environment Level of Independence: Independent             OT Diagnosis: Acute pain   OT Problem List: Decreased range of motion;Impaired balance (sitting and/or standing);Decreased knowledge of use of DME or AE;Decreased knowledge of precautions;Pain   OT Treatment/Interventions: Self-care/ADL training;DME and/or AE instruction;Therapeutic activities;Patient/family education;Balance training    OT Goals(Current goals can be found in the care plan section) Acute Rehab OT Goals Patient Stated Goal: not stated OT Goal Formulation: With patient Time For Goal Achievement: 01/14/14 Potential to Achieve Goals: Good ADL Goals Pt Will Perform Grooming: with modified independence;standing Pt Will Perform Lower Body Dressing: with modified independence;with adaptive equipment;sit to/from stand Pt Will Transfer to Toilet: with modified independence;ambulating Pt Will Perform Toileting - Clothing Manipulation and hygiene: with modified independence;sit to/from stand;with adaptive equipment Additional ADL Goal #1: Pt will independently verbalize and demonstrate 3/3 back precautions.  OT Frequency: Min 2X/week   Barriers to D/C:            Co-evaluation              End of Session Equipment Utilized During Treatment: Gait belt;Rolling walker;Back brace Nurse Communication: Other (comment) (dizzy)  Activity Tolerance: Other (comment) (dizzy) Patient left: in bed;with call bell/phone within reach;with family/visitor present   Time: [id number] OT Time Calculation (min): 25 min Charges:  OT General Charges $OT Visit: 1 Procedure OT Evaluation $Initial OT Evaluation Tier I: 1 Procedure OT Treatments $Self Care/Home Management : 8-22 mins G-CodesEarlie Fitzpatrick:    Tyler Fitzpatrick L  OTR/L [phone] 01/07/2014, 4:58 PM

## 2014-01-07 NOTE — Transfer of Care (Signed)
Immediate Anesthesia Transfer of Care Note  Patient: Tyler Fitzpatrick  Procedure(s) Performed: Procedure(s) with comments: POSTERIOR LUMBAR FUSION 1 LEVEL (N/A) - POSTERIOR LUMBAR FUSION 1 LEVEL L5-S1  Patient Location: PACU  Anesthesia Type:General  Level of Consciousness: awake, alert  and oriented  Airway & Oxygen Therapy: Patient Spontanous Breathing and Patient connected to nasal cannula oxygen  Post-op Assessment: Report given to PACU RN, Post -op Vital signs reviewed and stable and Patient moving all extremities X 4  Post vital signs: Reviewed and stable  Complications: No apparent anesthesia complications

## 2014-01-07 NOTE — Op Note (Signed)
Date of procedure: 01/07/2014  Date of dictation: same  Service: Neurosurgery  Preoperative diagnosis: L5-S1 grade 1 lytic spondylolisthesis with foraminal stenosis and intractable back pain and radiculopathy.  Postoperative diagnosis: same  Procedure Name: L5-S1 decompressive laminectomy/facetectomy and foraminotomies (Gill procedure) independent of need of interbody fusion.  L5-S1 posterior lumbar interbody fusion utilizing interbody peek cages and local autograft.  L5-S1 posterior lateral arthrodesis utilizing nonsegmental pedicle screw instrumentation and local autograft.  Surgeon:Dmauri Rosenow A.Jaylena Holloway, M.D.  Asst. Surgeon: Conchita ParisNundkumar  Anesthesia: General  Indication:63 year old male with intractable back and right lower chamois pain. Workup demonstrates evidence for grade 1 L5-S1 lytic spondylolisthesis with marked foraminal collapse and stenosis. Patient presents now for decompression infusion in hopes of improving his symptoms.  Operative note:after induction of anesthesia, patient positioned prone onto Wilson frame and her purply padded. Lumbar region prepped and draped. Incision made overlying L5-S1. Dissection performed. Retractor placed. Intraoperative fluoroscopy used. Level confirmed. Decompressive laminectomy and bilateral inferior facetectomies were performed using Leksell rongeurs Kerrison rongeurs and high-speed drill. Rudimentary inferior facets off of the L5 level were also resected as well as a bony pannus within the pars interarticularis completing a so-called Gill procedure. L5 and S1 nerve roots identified and wide decompressive foraminotomies performed along the course the exiting nerve roots. Bilateral discectomies performed at L5-S1. Disc space distracted to 10 mm and a 10 mm distractor left and on the patient's left side. Thecal sac and nerve respect on the right side. Disc space reamed and then prepared for interbody fusion. 10 x 22 x 6 Medtronic capstone cage was impacted  into place and rotated into position. Distractor was removed patient's left side. Disc space was again prepared. Soft tissues removed and interspace. Disc space packed with morselized autograft harvested from the laminectomy. Second cage and packed into place and rotated into position. Pedicles of L5 and S1 identified using surface landmarks and intraoperative fluoroscopy. Pilot holes drilled over the pedicle. Each pedicle probed using a pedicle awl. Each pedicle awl track probed and found to be solidly within the bone. Each pedicle awl track was then tapped and probed once again. 6.75 x 40 mm Stryker radius brand screws were placed bilaterally at L5 and S1. Transverse processes of L5 and superior facet of S1 were decorticated using high-speed drill. Morselized autograft packed posterior laterally for later fusion. Short segment titanium rod placed over the screw heads at L5 and S1. Locking caps placed over the screw heads. Locking caps engaged with the construct under compression. Final images revealed good position bone graft and hardware at proper upper level with normal lamina spine. Wound irrigated one final time. Gelfoam placed topically for hemostasis which Ascent be good. Vancomycin powder placed in the deep wound space. Wounds placed in layers with Vicryl sutures. Steri-Strips sterile dressing applied. No apparent complications. Patient tolerated the procedure well and he returns to the recovery room postop.

## 2014-01-08 MED ORDER — DIAZEPAM 5 MG PO TABS
5.0000 mg | ORAL_TABLET | Freq: Four times a day (QID) | ORAL | Status: DC | PRN
Start: 2014-01-08 — End: 2021-03-04

## 2014-01-08 MED ORDER — OXYCODONE-ACETAMINOPHEN 10-325 MG PO TABS: 1.0000 | ORAL_TABLET | ORAL | Status: DC | PRN

## 2014-01-08 NOTE — Evaluation (Signed)
Physical Therapy Evaluation Patient Details Name: Tyler PrestoRoger A Keiffer MRN: [id number] DOB: 05/26/1950 Today's Date: 01/08/2014   History of Present Illness  63 y.o. s/p POSTERIOR LUMBAR FUSION 1 LEVEL L5-S1.  Clinical Impression  Patient evaluated by Physical Therapy with no further acute PT needs identified. All education has been completed and the patient has no further questions. Ambulates generally well up to 500 feet today without an assistive device. Requires min assist to stand and this was practiced with the patient's wife who will be able to provide 24 hour care at home. Safely completed stair training. See below for any follow-up Physial Therapy or equipment needs. PT is signing off. Thank you for this referral.     Follow Up Recommendations No PT follow up    Equipment Recommendations  None recommended by PT    Recommendations for Other Services       Precautions / Restrictions Precautions Precautions: Back;Fall Precaution Booklet Issued: Yes (comment) Precaution Comments: educated on and reviewed back precautions Required Braces or Orthoses: Spinal Brace Spinal Brace: Lumbar corset;Applied in sitting position Restrictions Weight Bearing Restrictions: No      Mobility  Bed Mobility               General bed mobility comments: pt verbalizes understanding of this  Transfers Overall transfer level: Needs assistance Equipment used: None Transfers: Sit to/from Stand Sit to Stand: Min assist         General transfer comment: Min assist for boost to stand due to LE weakness. Performed from lowest bed setting and reclining chair. Wife present and participated in safe assist for pt to stand.  Ambulation/Gait Ambulation/Gait assistance: Supervision Ambulation Distance (Feet): 500 Feet Assistive device: None Gait Pattern/deviations: Step-through pattern;Decreased stride length   Gait velocity interpretation: Below normal speed for age/gender General Gait Details:  Slow and guarded, one instance of instability but able to self correct.   Stairs Stairs: Yes Stairs assistance: Min guard Stair Management: No rails Number of Stairs: 1 (x2) General stair comments: Educated pt and wife on safe stair navigation and guarding techniques. Able to ascend single step similar to home environment. No instance of leg buckling. Able to teach back correct sequencing on second trial.  Wheelchair Mobility    Modified Rankin (Stroke Patients Only)       Balance Overall balance assessment: Needs assistance Sitting-balance support: No upper extremity supported;Feet supported Sitting balance-Leahy Scale: Good     Standing balance support: No upper extremity supported Standing balance-Leahy Scale: Fair                               Pertinent Vitals/Pain Pain Assessment: 0-10 Pain Score: 8  Pain Location: back Pain Descriptors / Indicators: Stabbing Pain Intervention(s): Monitored during session;Repositioned;Limited activity within patient's tolerance    Home Living Family/patient expects to be discharged to:: Private residence Living Arrangements: Spouse/significant other Available Help at Discharge: Family;Available 24 hours/day Type of Home: House Home Access: Stairs to enter Entrance Stairs-Rails: None Entrance Stairs-Number of Steps: 1 Home Layout: One level;Other (Comment) (with basement) Home Equipment: Cane - single point;Other (comment) (access to walker)      Prior Function Level of Independence: Independent               Hand Dominance   Dominant Hand: Right    Extremity/Trunk Assessment   Upper Extremity Assessment: Defer to OT evaluation  Lower Extremity Assessment: Generalized weakness         Communication   Communication: No difficulties  Cognition Arousal/Alertness: Awake/alert Behavior During Therapy: WFL for tasks assessed/performed Overall Cognitive Status: Within Functional Limits for  tasks assessed                      General Comments General comments (skin integrity, edema, etc.): Reviewed donning/doffing brace.    Exercises        Assessment/Plan    PT Assessment Patent does not need any further PT services  PT Diagnosis Acute pain;Generalized weakness   PT Problem List    PT Treatment Interventions     PT Goals (Current goals can be found in the Care Plan section) Acute Rehab PT Goals Patient Stated Goal: Play golf again PT Goal Formulation: All assessment and education complete, DC therapy    Frequency     Barriers to discharge        Co-evaluation               End of Session Equipment Utilized During Treatment: Gait belt Activity Tolerance: Patient tolerated treatment well Patient left: in chair;with call bell/phone within reach;with family/visitor present Nurse Communication: Mobility status         Time: [id number] PT Time Calculation (min): 24 min   Charges:   PT Evaluation $Initial PT Evaluation Tier I: 1 Procedure PT Treatments $Gait Training: 8-22 mins   PT G CodesBerton Mount:          Jonetta Dagley S 01/08/2014, 9:48 AM  Charlsie MerlesLogan Secor Reniyah [place], PT [phone]

## 2014-01-08 NOTE — Discharge Summary (Signed)
Physician Discharge Summary  Patient ID: Debbora PrestoRoger A Soth MRN: [id number] DOB/AGE: 63/03/1950 63 y.o.  Admit date: 01/07/2014 Discharge date: 01/08/2014  Admission Diagnoses:  Discharge Diagnoses:  Principal Problem:   Spondylolisthesis at L5-S1 level   Discharged Condition: good  Hospital Course: the patient was admitted to the hospital where he underwent uncomplicated L5-S1 decompression and fusion. Postoperatively he is doing well. Preoperative back and leg pain much improved. Strength and sensation intact. Up and ambulating without difficulty. Ready for discharge home.  Consults:   Significant Diagnostic Studies:   Treatments:   Discharge Exam: Blood pressure 136/63, pulse 74, temperature 98.9 F (37.2 C), temperature source Oral, resp. rate 18, weight 103.93 kg (229 lb 2 oz), SpO2 98 %. awake and alert. Oriented and appropriate. Motor and sensory function intact. Wound clean and dry. Chest and abdomen benign.  Disposition: Final discharge disposition not confirmed     Medication List    TAKE these medications        carbidopa-levodopa 25-100 MG per tablet  Commonly known as:  SINEMET IR  Take 2 tablets by mouth 3 (three) times daily.     celecoxib 200 MG capsule  Commonly known as:  CELEBREX  Take 200 mg by mouth daily.     cetirizine 10 MG tablet  Commonly known as:  ZYRTEC  Take 10 mg by mouth daily.     clonazePAM 0.5 MG tablet  Commonly known as:  KLONOPIN  Take 0.5 mg by mouth 2 (two) times daily.     diazepam 5 MG tablet  Commonly known as:  VALIUM  Take 1-2 tablets (5-10 mg total) by mouth every 6 (six) hours as needed for muscle spasms.     LINZESS 145 MCG Caps capsule  Generic drug:  Linaclotide  Take 145 mcg by mouth daily.     omeprazole 20 MG capsule  Commonly known as:  PRILOSEC  Take 20 mg by mouth daily.     oxyCODONE-acetaminophen 10-325 MG per tablet  Commonly known as:  PERCOCET  Take 1-2 tablets by mouth every 4 (four) hours as  needed for pain.     predniSONE 2.5 MG tablet  Commonly known as:  DELTASONE  Take 2.5 mg by mouth daily with breakfast.           Follow-up Information    Follow up with [facility] PaciniPOOL,Sebastin Perlmutter A, MD.   Specialty:  Neurosurgery   Contact information:   [address] [place] [address] [phone]       Signed: Adie Vilar A 01/08/2014, 9:54 AM

## 2014-01-08 NOTE — Progress Notes (Signed)
Pt doing well. Pt and wife given D/C instructions with Rx's, verbal understanding was provided. Pt's incision is covered with Honeycomb and is clean and dry with no sign of infection. Pt's IV was removed prior to D/C. Pt has corsett brace for home use. Pt D/C'd home via wheelchair @ 1030 per MD order. Pt is stable @ D/C and has no other needs at this time. Rema FendtAshley Evolette Pendell, RN

## 2014-01-08 NOTE — Progress Notes (Signed)
Occupational Therapy Treatment Patient Details Name: Tyler PrestoRoger A Stclair MRN: [id number] DOB: 08/20/1950 Today's Date: 01/08/2014    History of present illness 63 y.o. s/p POSTERIOR LUMBAR FUSION 1 LEVEL L5-S1.   OT comments  Pt progressing. Feel pt is safe to d/c home with spouse available to assist.   Follow Up Recommendations  No OT follow up;Supervision - Intermittent    Equipment Recommendations  3 in 1 bedside comode;Other (comment) (AE)    Recommendations for Other Services      Precautions / Restrictions Precautions Precautions: Back;Fall Precaution Comments: reviewed precautions. Pt able to state 2/3 precautions Required Braces or Orthoses: Spinal Brace Spinal Brace: Lumbar corset;Applied in sitting position Restrictions Weight Bearing Restrictions: No       Mobility Bed Mobility               General bed mobility comments: pt verbalizes understanding of this  Transfers Overall transfer level: Needs assistance   Transfers: Sit to/from Stand Sit to Stand: Min guard         General transfer comment: Min guard for safety.    Balance                                   ADL Overall ADL's : Needs assistance/impaired                 Upper Body Dressing : Min guard;Sitting;Standing   Lower Body Dressing: Minimal assistance;Sit to/from stand;With adaptive equipment   Toilet Transfer: Min guard (sit <> stand from chair)             General ADL Comments: Educated on toilet aide for hygiene and what pt could use for this. Practiced with AE with LB dressing. Pt able to state to use cup for teeth care and talked about maintaining precautions with grooming. Discussed shower transfer and recommended spouse be with him for this. Educated on safety (rugs, sitting for LB ADLs). Reviewed back brace info. Briefly discussed 3 in 1 and pt does not want it. Discussed where to purchase AE. Showed pt leg exercise. Discussed positioning of pillows.  Discussed elastic shoe laces.      Vision                     Perception     Praxis      Cognition  Awake/Alert Behavior During Therapy: WFL for tasks assessed/performed Overall Cognitive Status: Within Functional Limits for tasks assessed (decreased recall of one precaution)                       Extremity/Trunk Assessment               Exercises     Shoulder Instructions       General Comments      Pertinent Vitals/ Pain       Pain Assessment: 0-10 Pain Score:  (9-10) Pain Location: back Pain Descriptors / Indicators: Throbbing Pain Intervention(s): Monitored during session  Home Living                                          Prior Functioning/Environment              Frequency Min 2X/week     Progress Toward Goals  OT Goals(current goals can  now be found in the care plan section)  Progress towards OT goals: Progressing toward goals  Acute Rehab OT Goals Patient Stated Goal: not stated OT Goal Formulation: With patient Time For Goal Achievement: 01/14/14 Potential to Achieve Goals: Good ADL Goals Pt Will Perform Grooming: with modified independence;standing Pt Will Perform Lower Body Dressing: with modified independence;with adaptive equipment;sit to/from stand Pt Will Transfer to Toilet: with modified independence;ambulating Pt Will Perform Toileting - Clothing Manipulation and hygiene: with modified independence;sit to/from stand;with adaptive equipment Additional ADL Goal #1: Pt will independently verbalize and demonstrate 3/3 back precautions.  Plan Discharge plan remains appropriate    Co-evaluation                 End of Session Equipment Utilized During Treatment: Gait belt;Back brace   Activity Tolerance Patient tolerated treatment well   Patient Left in chair   Nurse Communication Other (comment) (dressed ready to go)        Time: 0912-0928 OT Time Calculation (min): 16  min  Charges: OT General Charges $OT Visit: 1 Procedure OT Treatments $Self Care/Home Management : 8-22 mins  Earlie RavelingStraub, Baxter Gonzalez L OTR/L [phone] 01/08/2014, 9:44 AM

## 2014-01-08 NOTE — Discharge Instructions (Signed)
Spinal Fusion °Spinal fusion is a procedure to make 2 or more of the bones in your spinal column (vertebrae) grow together (fuse). This procedure stops movement between the vertebrae and can relieve pain and prevent deformity.  °Spinal fusion is used to treat the following conditions: °· Fractures of the spine. °· Herniated disk (the spongy material [cartilage] between the vertebrae). °· Abnormal curvatures of the spine, such as scoliosis or kyphosis. °· A weak or an unstable spine, caused by infections or tumor. °RISKS AND COMPLICATIONS °Complications associated with spinal fusion are rare, but they can occur. Possible complications include: °· Bleeding. °· Infection near the incision. °· Nerve damage. Signs of nerve damage are back pain, pain in one or both legs, weakness, or numbness. °· Spinal fluid leakage. °· Blood clot in your leg, which can move to your lungs. °· Difficulty controlling urination or bowel movements. °BEFORE THE PROCEDURE °· A medical evaluation will be done. This will include a physical exam, blood tests, and imaging exams. °· You will talk with an anesthesiologist. This is the person who will be in charge of the anesthesia during the procedure. Spinal fusion usually requires that you are asleep during the procedure (general anesthesia). °· You will need to stop taking certain medicines, particularly those associated with an increased risk of bleeding. Ask your caregiver about changing or stopping your regular medicines. °· If you smoke, you will need to stop at least 2 weeks before the procedure. Smoking can slow down the healing process, especially fusion of the vertebrae, and increase the risk of complications. °· Do not eat or drink anything for at least 8 hours before the procedure. °PROCEDURE  °A cut (incision) is made over the vertebrae that will be fused. The back muscles are separated from the vertebrae. If you are having this procedure to treat a herniated disk, the disc material  pressing on the nerve root is removed (decompression). The area where the disk is removed is then filled with extra bone. Bone from another part of your body (autogenous bone) or bone from a bone donor (allograft bone) may be used. The extra bone promotes fusion between the vertebrae. Sometimes, specific medicines are added to the fusion area to promote bone healing. In most cases, screws and rods or metal plates will be used to attach the vertebrae to stabilize them while they fuse.  °AFTER THE PROCEDURE  °· You will stay in a recovery area until the anesthesia has worn off. Your blood pressure and pulse will be checked frequently. °· You will be given antibiotics to prevent infection. °· You may continue to receive fluids through an intravenous (IV) tube while you are still in the hospital. °· Pain after surgery is normal. You will be given pain medicine. °· You will be taught how to move correctly and how to stand and walk. While in bed, you will be instructed to turn frequently, using a "log rolling" technique, in which the entire body is moved without twisting the back. °Document Released: 11/20/2002 Document Revised: 05/16/2011 Document Reviewed: 05/06/2010 °ExitCare® Patient Information ©2015 [employer]. This information is not intended to replace advice given to you by your health care provider. Make sure you discuss any questions you have with your health care provider. ° ° °Wound Care °Keep incision covered and dry for two days.  If you shower, cover incision with plastic wrap.  °Do not put any creams, lotions, or ointments on incision. °Leave steri-strips on back.  They will fall off by   themselves. °Activity °Walk each and every day, increasing distance each day. °No lifting greater than 5 lbs.  Avoid excessive neck motion. °No driving for 2 weeks; may ride as a passenger locally. °If provided with back brace, wear when out of bed.  It is not necessary to wear brace in bed. °Diet °Resume your normal  diet.  °Return to Work °Will be discussed at you follow up appointment. °Call Your Doctor If Any of These Occur °Redness, drainage, or swelling at the wound.  °Temperature greater than 101 degrees. °Severe pain not relieved by pain medication. °Incision starts to come apart. °Follow Up Appt °Call today for appointment in 1-2 weeks ([phone]) or for problems.  If you have any hardware placed in your spine, you will need an x-ray before your appointment. ° °Wound Care °Keep incision covered and dry for one week.  If you shower prior to then, cover incision with plastic wrap.  °You may remove outer bandage after one week and shower.  °Do not put any creams, lotions, or ointments on incision. °Leave steri-strips on neck.  They will fall off by themselves. °Activity °Walk each and every day, increasing distance each day. °No lifting greater than 5 lbs.  Avoid excessive neck motion. °No driving for 2 weeks; may ride as a passenger locally. °If provided with back brace, wear when out of bed.  It is not necessary to wear brace in bed. °Diet °Resume your normal diet.  °Return to Work °Will be discussed at you follow up appointment. °Call Your Doctor If Any of These Occur °Redness, drainage, or swelling at the wound.  °Temperature greater than 101 degrees. °Severe pain not relieved by pain medication. °Incision starts to come apart. °Follow Up Appt °Call today for appointment in 1-2 weeks ([phone]) or for problems.  If you have any hardware placed in your spine, you will need an x-ray before your appointment. ° ° °Spinal Fusion °Spinal fusion is a procedure to make 2 or more of the bones in your spinal column (vertebrae) grow together (fuse). This procedure stops movement between the vertebrae and can relieve pain and prevent deformity.  °Spinal fusion is used to treat the following conditions: °· Fractures of the spine. °· Herniated disk (the spongy material [cartilage] between the vertebrae). °· Abnormal curvatures of the  spine, such as scoliosis or kyphosis. °· A weak or an unstable spine, caused by infections or tumor. °RISKS AND COMPLICATIONS °Complications associated with spinal fusion are rare, but they can occur. Possible complications include: °· Bleeding. °· Infection near the incision. °· Nerve damage. Signs of nerve damage are back pain, pain in one or both legs, weakness, or numbness. °· Spinal fluid leakage. °· Blood clot in your leg, which can move to your lungs. °· Difficulty controlling urination or bowel movements. °BEFORE THE PROCEDURE °· A medical evaluation will be done. This will include a physical exam, blood tests, and imaging exams. °· You will talk with an anesthesiologist. This is the person who will be in charge of the anesthesia during the procedure. Spinal fusion usually requires that you are asleep during the procedure (general anesthesia). °· You will need to stop taking certain medicines, particularly those associated with an increased risk of bleeding. Ask your caregiver about changing or stopping your regular medicines. °· If you smoke, you will need to stop at least 2 weeks before the procedure. Smoking can slow down the healing process, especially fusion of the vertebrae, and increase the risk of complications. °· Do   not eat or drink anything for at least 8 hours before the procedure. °PROCEDURE  °A cut (incision) is made over the vertebrae that will be fused. The back muscles are separated from the vertebrae. If you are having this procedure to treat a herniated disk, the disc material pressing on the nerve root is removed (decompression). The area where the disk is removed is then filled with extra bone. Bone from another part of your body (autogenous bone) or bone from a bone donor (allograft bone) may be used. The extra bone promotes fusion between the vertebrae. Sometimes, specific medicines are added to the fusion area to promote bone healing. In most cases, screws and rods or metal plates  will be used to attach the vertebrae to stabilize them while they fuse.  °AFTER THE PROCEDURE  °· You will stay in a recovery area until the anesthesia has worn off. Your blood pressure and pulse will be checked frequently. °· You will be given antibiotics to prevent infection. °· You may continue to receive fluids through an intravenous (IV) tube while you are still in the hospital. °· Pain after surgery is normal. You will be given pain medicine. °· You will be taught how to move correctly and how to stand and walk. While in bed, you will be instructed to turn frequently, using a "log rolling" technique, in which the entire body is moved without twisting the back. °Document Released: 11/20/2002 Document Revised: 05/16/2011 Document Reviewed: 05/06/2010 °ExitCare® Patient Information ©2015 [employer]. This information is not intended to replace advice given to you by your health care provider. Make sure you discuss any questions you have with your health care provider. ° ° °

## 2016-01-30 IMAGING — RF DG MYELOGRAPHY LUMBAR INJ LUMBOSACRAL
6 of 17 series · 6 of 17 positions shown · non-contrast
Comparison: MRI 10/11/2013.

CLINICAL DATA: Persistent lumbar spine pain radiating into the
RIGHT gluteal region. Symptoms aggravated by prolonged standing.
Initial encounter. Failure of conservative treatment. History of
prior lumbar spine operation L4-L5 8661.
TECHNIQUE: Contiguous axial images were obtained through the Lumbar spine after
the intrathecal infusion of infusion. Coronal and sagittal
reconstructions were obtained of the axial image sets.

[Series 2: (hospital) · 1 of 1 slices shown]
[im 1/1]
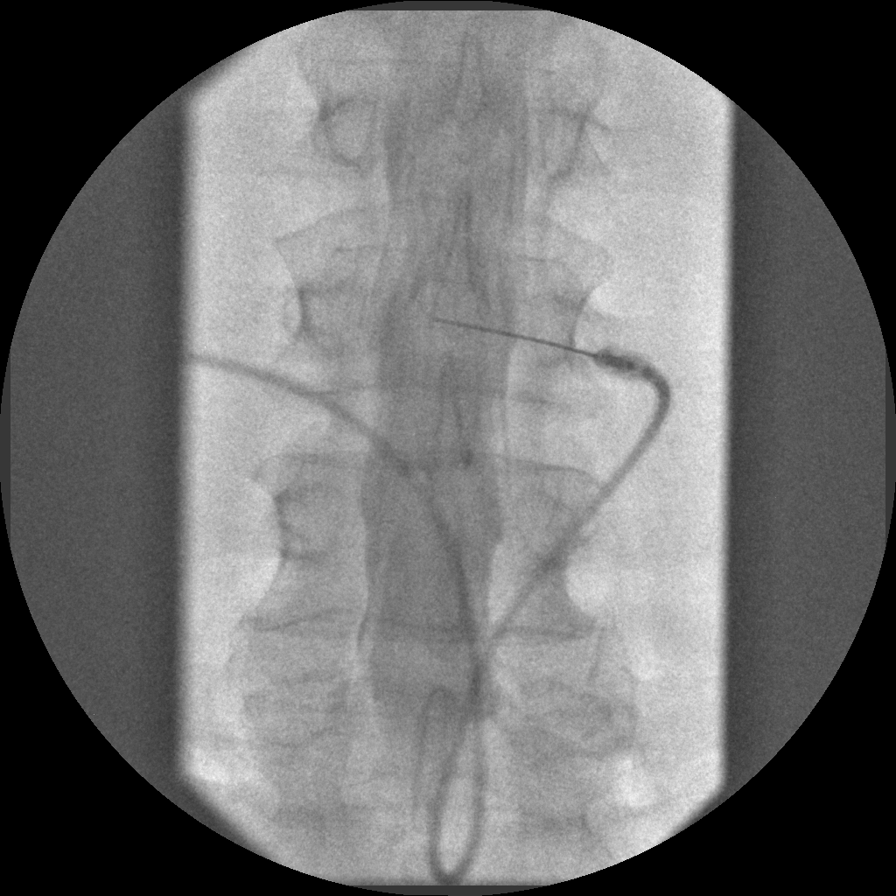

[Series 3: myelogram  white · 1 of 1 slices shown (1 of 5)]
[im 1/1]
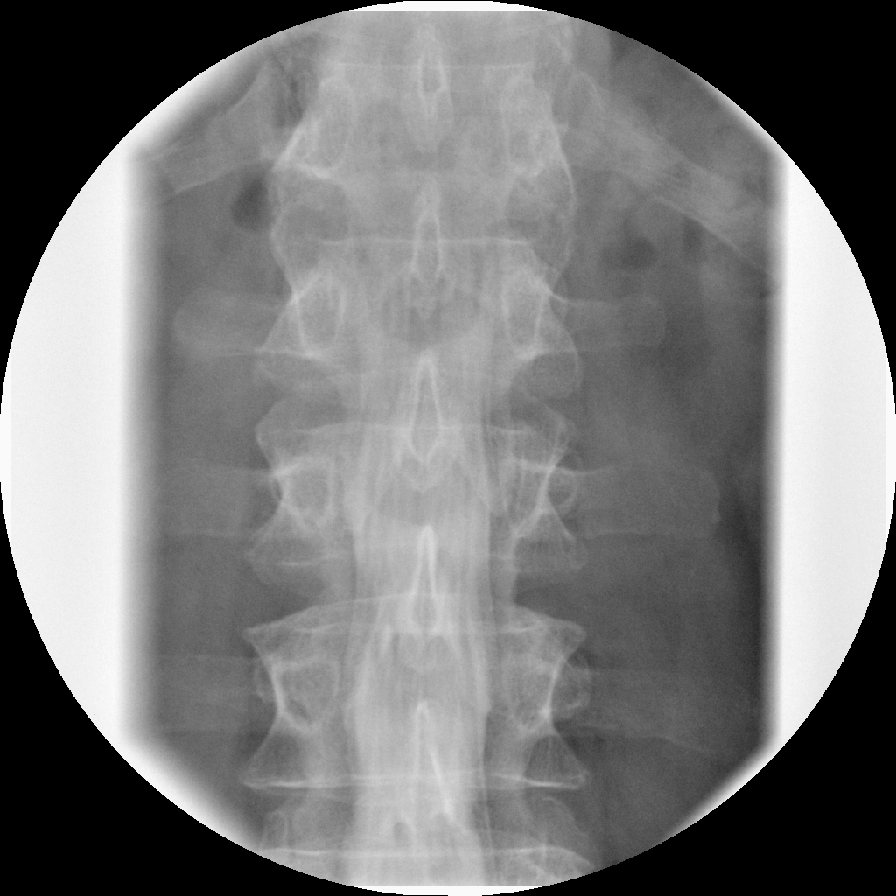

[Series 4: myelogram  white · 1 of 1 slices shown (2 of 5)]
[im 1/1]
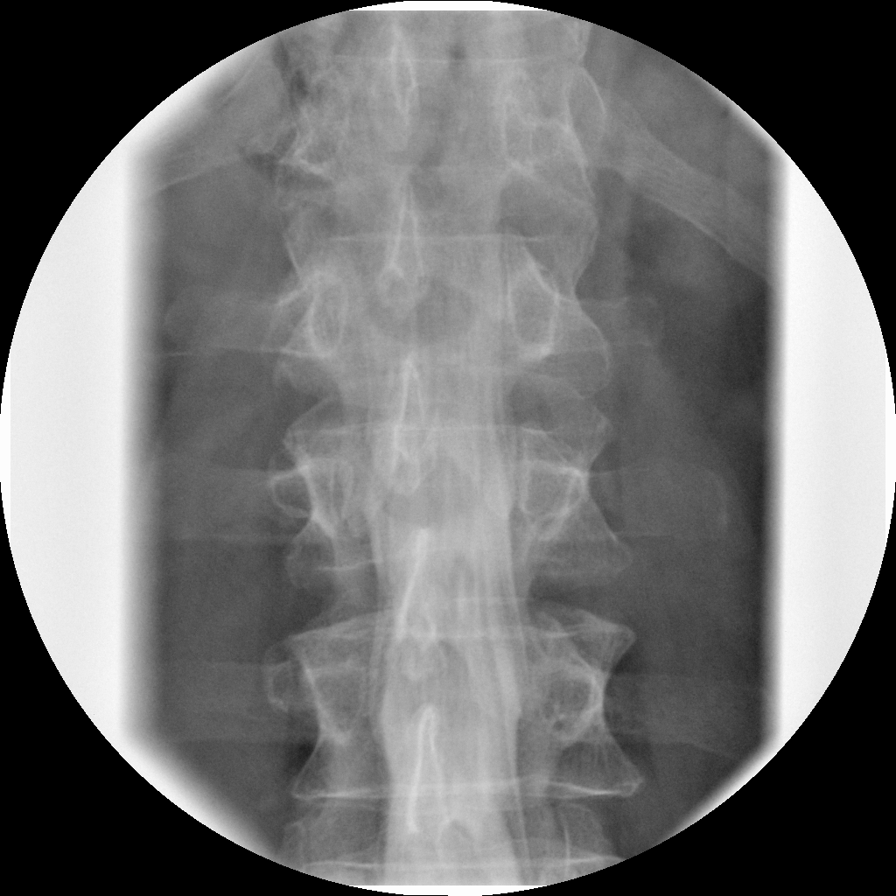

[Series 5: myelogram  white · 1 of 1 slices shown (3 of 5)]
[im 1/1]
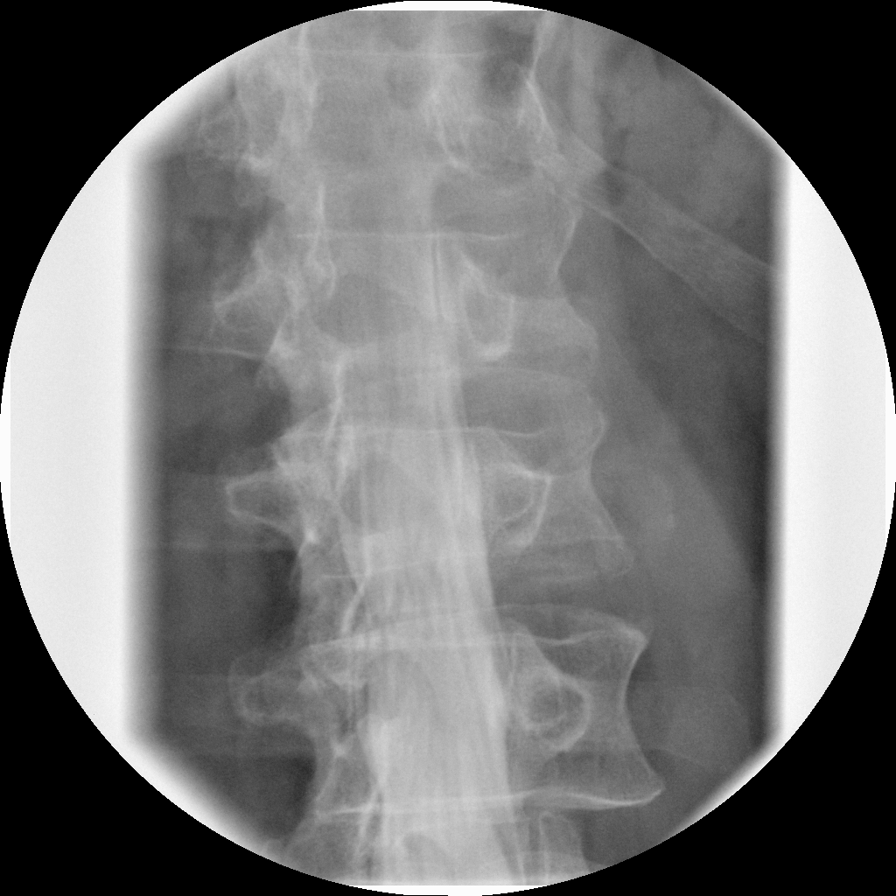

[Series 6: myelogram  white · 1 of 1 slices shown (4 of 5)]
[im 1/1]
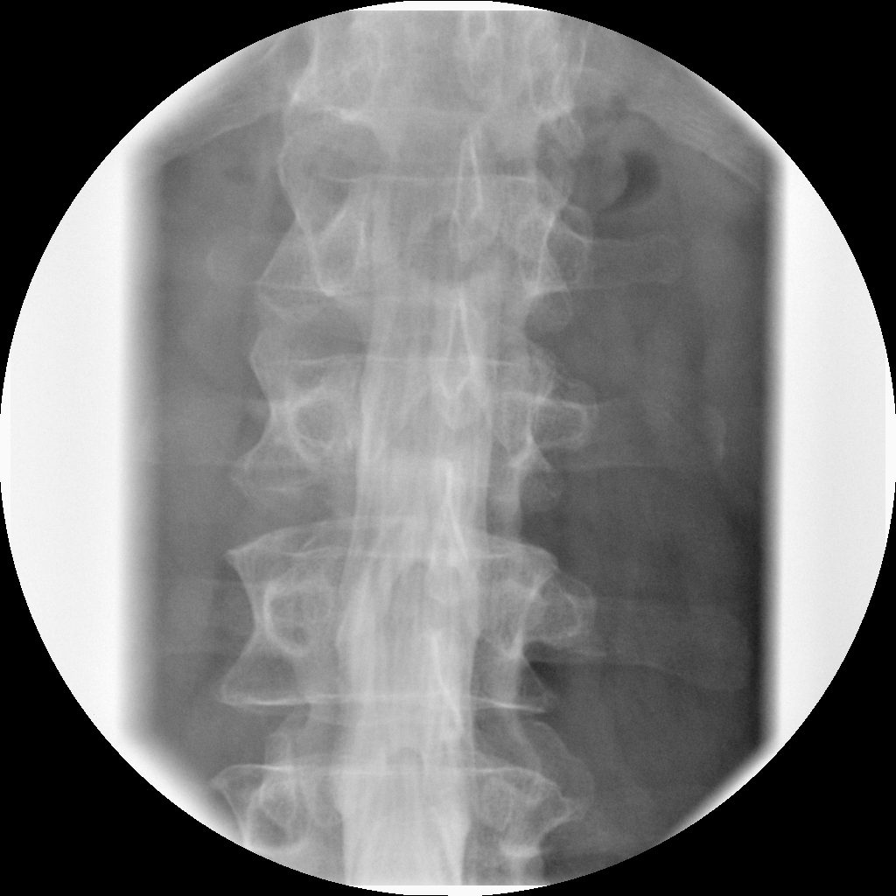

[Series 7: myelogram  white · 1 of 1 slices shown (5 of 5)]
[im 1/1]
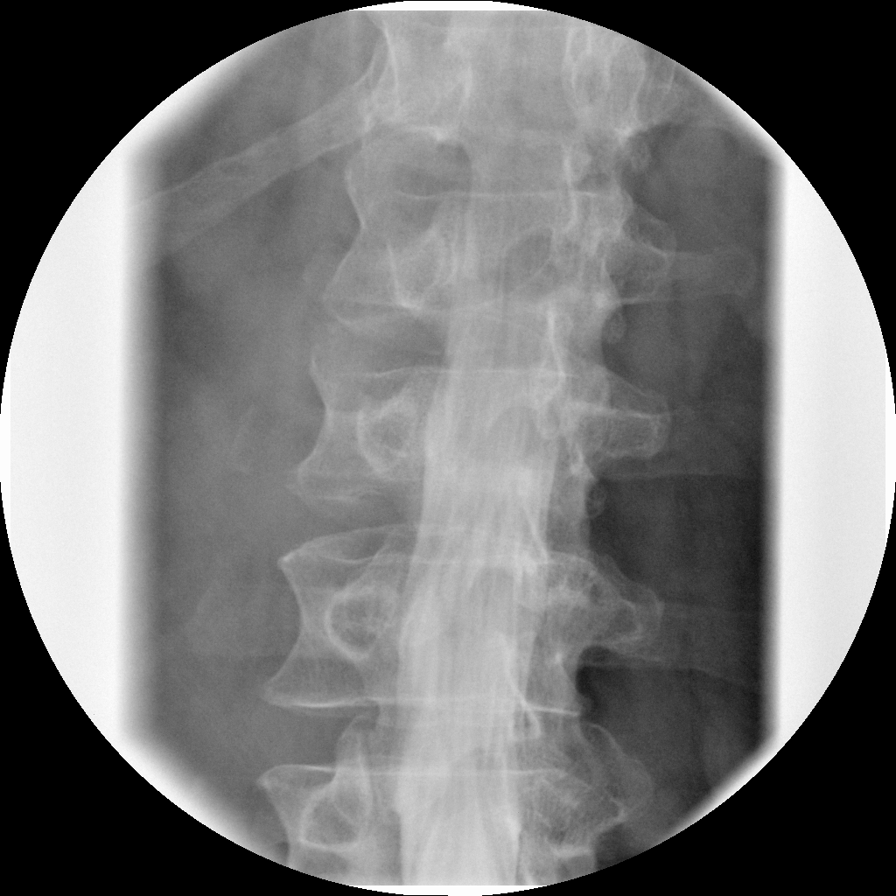

[6 of 17 positions shown; findings below may reference images not displayed]

EXAM:
LUMBAR MYELOGRAM

FLUOROSCOPY TIME:  0 min 42 seconds

PROCEDURE:
After thorough discussion of risks and benefits of the procedure
including bleeding, infection, injury to nerves, blood vessels,
adjacent structures as well as headache and CSF leak, written and
oral informed consent was obtained. Consent was obtained by Dr.
Lely Arzu. Time out form was completed.

Patient was positioned prone on the fluoroscopy table. Local
anesthesia was provided with 1% lidocaine without epinephrine after
prepped and draped in the usual sterile fashion. Puncture was
performed at L2-L3 using a 3 1/2 inch 22 gauge pencil point [place]
spinal needle via RIGHT paramedian approach. Using a single pass
through the dura, the needle was placed within the thecal sac, with
return of clear CSF. 15 mL of Mmnipaque-CC8 was injected into the
thecal sac, with normal opacification of the nerve roots and cauda
equina consistent with free flow within the subarachnoid space.

I personally performed the lumbar puncture and administered the
intrathecal contrast. I also personally supervised acquisition of
the myelogram images.
FINDINGS: LUMBAR MYELOGRAM FINDINGS:

In the standing upright neutral position, the alignment of the
lumbar spine shows a gentle levoconvex curve with the apex at L3.
Standing films show 11 mm anterolisthesis of L5 on S1. This does not
change between flexion and extension maneuvers. Flexion and
extension is sub optimal. Shallow disc bulging is present at L2-L3
and L3-L4. No instability is present at other levels. 4 mm of grade
I retrolisthesis of L4 on L5 associated with collapse of the disc
space.

CT LUMBAR MYELOGRAM FINDINGS:

Segmentation: 5 lumbar type vertebral bodies are present.

Alignment: Grade I anterolisthesis of L5 on S1 appears unchanged. No
other spondylolisthesis. Grade I retrolisthesis of L4 on L5 is
present, measuring 4 mm, unchanged from standing radiographs.

Vertebrae: Preserved vertebral body height. No aggressive osseous
lesions. Small sclerotic lesion in the inferior L2 vertebra is most
compatible with a benign bone island.

Conus medullaris: Normal termination at L1-L2.

Paraspinal tissues: Normal. SI joints appear within normal limits
for age.

Disc levels:

L1-L2:  Negative.

L2-L3:  Negative.

L3-L4:  Negative.

L4-L5: LEFT laminotomy. Moderate RIGHT and mild to moderate LEFT
facet arthrosis. The central canal is decompressed. The disc is
degenerated. Vacuum disc is present. There is mild bilateral
foraminal stenosis associated with retrolisthesis and loss of disc
height. Minimal contribution from facet hypertrophy. Lateral
recesses are patent.

L5-S1: Bilateral L5 pars defects. This accounts for the
anterolisthesis. Small fragment heterotopic bone is present inferior
to the LEFT L4 inferior articular process. There appears to be a
pseudoarthrosis between the RIGHT inferior L4 articular process and
the RIGHT L5 inferior articular process with gas in the
pseudoarthrosis. Small fragment heterotopic bone is present anterior
to the pars defect.

The disc is degenerated with loss of height and vacuum disc. Mild
uncoverage of the disc. No focal protrusions.

The central canal is patent. Both lateral recesses appear patent.
There is moderate LEFT and mild RIGHT foraminal stenosis associated
anterolisthesis and uncoverage of the disc.
IMPRESSION: LUMBAR MYELOGRAM IMPRESSION:

1. Technically successful lumbar puncture for lumbar myelogram.
2. Grade I anterolisthesis of L5 on S1 which does not change between
flexion and extension maneuvers.
3. Degenerative grade I retrolisthesis of L4 on L5 associated with
collapse of the disc space.

CT LUMBAR MYELOGRAM IMPRESSION:

1. L4-L5 LEFT laminotomy with decompression of the central canal.
Grade I retrolisthesis and RIGHT-greater-than-LEFT facet arthrosis.
Mild symmetric foraminal stenosis associated with loss of disc
height and retrolisthesis.
2. L5-S1 grade I anterolisthesis with bilateral L5 pars defects.
Probable abnormal mechanics between the RIGHT inferior L5 articular
process which appears to have a pseudoarthrosis with the RIGHT L5
inferior articular process fragment. Mild bilateral foraminal
stenosis associated with anterolisthesis and uncoverage of the disc.

## 2021-03-01 ENCOUNTER — Emergency Department (HOSPITAL_COMMUNITY): Payer: Medicare Other

## 2021-03-01 ENCOUNTER — Encounter (HOSPITAL_COMMUNITY): Payer: Self-pay | Admitting: Emergency Medicine

## 2021-03-01 ENCOUNTER — Inpatient Hospital Stay (HOSPITAL_COMMUNITY)
Admission: EM | Admit: 2021-03-01 | Discharge: 2021-03-04 | DRG: 871 | Disposition: A | Discharge status: Hospice | Payer: Medicare Other | Attending: Internal Medicine | Admitting: Internal Medicine

## 2021-03-01 ENCOUNTER — Other Ambulatory Visit: Payer: Self-pay

## 2021-03-01 DIAGNOSIS — E669 Obesity, unspecified: Secondary | ICD-10-CM | POA: Diagnosis present

## 2021-03-01 DIAGNOSIS — J189 Pneumonia, unspecified organism: Secondary | ICD-10-CM | POA: Diagnosis not present

## 2021-03-01 DIAGNOSIS — G4733 Obstructive sleep apnea (adult) (pediatric): Secondary | ICD-10-CM | POA: Diagnosis present

## 2021-03-01 DIAGNOSIS — Z66 Do not resuscitate: Secondary | ICD-10-CM | POA: Diagnosis present

## 2021-03-01 DIAGNOSIS — I959 Hypotension, unspecified: Secondary | ICD-10-CM | POA: Diagnosis not present

## 2021-03-01 DIAGNOSIS — G9341 Metabolic encephalopathy: Secondary | ICD-10-CM | POA: Diagnosis present

## 2021-03-01 DIAGNOSIS — Z7952 Long term (current) use of systemic steroids: Secondary | ICD-10-CM

## 2021-03-01 DIAGNOSIS — R4182 Altered mental status, unspecified: Secondary | ICD-10-CM | POA: Diagnosis present

## 2021-03-01 DIAGNOSIS — I160 Hypertensive urgency: Secondary | ICD-10-CM

## 2021-03-01 DIAGNOSIS — K219 Gastro-esophageal reflux disease without esophagitis: Secondary | ICD-10-CM | POA: Diagnosis present

## 2021-03-01 DIAGNOSIS — F0390 Unspecified dementia without behavioral disturbance: Secondary | ICD-10-CM

## 2021-03-01 DIAGNOSIS — G20A1 Parkinson's disease without dyskinesia, without mention of fluctuations: Secondary | ICD-10-CM

## 2021-03-01 DIAGNOSIS — K5909 Other constipation: Secondary | ICD-10-CM | POA: Diagnosis present

## 2021-03-01 DIAGNOSIS — R627 Adult failure to thrive: Secondary | ICD-10-CM | POA: Diagnosis present

## 2021-03-01 DIAGNOSIS — Z20822 Contact with and (suspected) exposure to covid-19: Secondary | ICD-10-CM | POA: Diagnosis present

## 2021-03-01 DIAGNOSIS — F02C18 Dementia in other diseases classified elsewhere, severe, with other behavioral disturbance: Secondary | ICD-10-CM | POA: Diagnosis present

## 2021-03-01 DIAGNOSIS — Z6833 Body mass index (BMI) 33.0-33.9, adult: Secondary | ICD-10-CM

## 2021-03-01 DIAGNOSIS — A419 Sepsis, unspecified organism: Principal | ICD-10-CM

## 2021-03-01 DIAGNOSIS — N179 Acute kidney failure, unspecified: Secondary | ICD-10-CM | POA: Diagnosis present

## 2021-03-01 DIAGNOSIS — G20 Parkinson's disease: Secondary | ICD-10-CM | POA: Diagnosis present

## 2021-03-01 DIAGNOSIS — I110 Hypertensive heart disease with heart failure: Secondary | ICD-10-CM | POA: Diagnosis present

## 2021-03-01 DIAGNOSIS — Z952 Presence of prosthetic heart valve: Secondary | ICD-10-CM

## 2021-03-01 DIAGNOSIS — Z720 Tobacco use: Secondary | ICD-10-CM

## 2021-03-01 DIAGNOSIS — G4752 REM sleep behavior disorder: Secondary | ICD-10-CM | POA: Diagnosis present

## 2021-03-01 DIAGNOSIS — Z9689 Presence of other specified functional implants: Secondary | ICD-10-CM | POA: Diagnosis present

## 2021-03-01 DIAGNOSIS — F22 Delusional disorders: Secondary | ICD-10-CM | POA: Diagnosis present

## 2021-03-01 DIAGNOSIS — I700 Atherosclerosis of aorta: Secondary | ICD-10-CM | POA: Diagnosis present

## 2021-03-01 DIAGNOSIS — J690 Pneumonitis due to inhalation of food and vomit: Secondary | ICD-10-CM | POA: Diagnosis present

## 2021-03-01 DIAGNOSIS — Z79899 Other long term (current) drug therapy: Secondary | ICD-10-CM

## 2021-03-01 HISTORY — DX: Delusional disorders: F22

## 2021-03-01 HISTORY — DX: Essential (primary) hypertension: I10

## 2021-03-01 HISTORY — DX: Unspecified dementia, unspecified severity, without behavioral disturbance, psychotic disturbance, mood disturbance, and anxiety: F03.90

## 2021-03-01 LAB — COMPREHENSIVE METABOLIC PANEL
ALT: <5 U/L (ref 0–44)
AST: 24 U/L (ref 15–41)
Albumin: 3.3 g/dL — ABNORMAL LOW (ref 3.5–5.0)
Alkaline Phosphatase: 84 U/L (ref 38–126)
Anion gap: 10 (ref 5–15)
BUN: 14 mg/dL (ref 8–23)
CO2: 26 mmol/L (ref 22–32)
Calcium: 8.7 mg/dL — ABNORMAL LOW (ref 8.9–10.3)
Chloride: 105 mmol/L (ref 98–111)
Creatinine, Ser: 0.83 mg/dL (ref 0.61–1.24)
GFR, Estimated: >60 mL/min (ref >60)
Glucose, Bld: 106 mg/dL — ABNORMAL HIGH (ref 70–99)
Potassium: 3.7 mmol/L (ref 3.5–5.1)
Sodium: 141 mmol/L (ref 135–145)
Total Bilirubin: 0.9 mg/dL (ref 0.3–1.2)
Total Protein: 6.6 g/dL (ref 6.5–8.1)

## 2021-03-01 LAB — CBC WITH DIFFERENTIAL/PLATELET
Abs Immature Granulocytes: 0.08 10*3/uL — ABNORMAL HIGH (ref 0.00–0.07)
Basophils Absolute: 0.1 10*3/uL (ref 0.0–0.1)
Basophils Relative: 0 %
Eosinophils Absolute: 0.1 10*3/uL (ref 0.0–0.5)
Eosinophils Relative: 1 %
HCT: 48.7 % (ref 39.0–52.0)
Hemoglobin: 15.7 g/dL (ref 13.0–17.0)
Immature Granulocytes: 1 %
Lymphocytes Relative: 12 %
Lymphs Abs: 1.7 10*3/uL (ref 0.7–4.0)
MCH: 30.7 pg (ref 26.0–34.0)
MCHC: 32.2 g/dL (ref 30.0–36.0)
MCV: 95.3 fL (ref 80.0–100.0)
Monocytes Absolute: 1.3 10*3/uL — ABNORMAL HIGH (ref 0.1–1.0)
Monocytes Relative: 9 %
Neutro Abs: 11.4 10*3/uL — ABNORMAL HIGH (ref 1.7–7.7)
Neutrophils Relative %: 77 %
Platelets: 198 10*3/uL (ref 150–400)
RBC: 5.11 MIL/uL (ref 4.22–5.81)
RDW: 13.3 % (ref 11.5–15.5)
WBC: 14.6 10*3/uL — ABNORMAL HIGH (ref 4.0–10.5)
nRBC: 0 % (ref 0.0–0.2)

## 2021-03-01 LAB — URINALYSIS, MICROSCOPIC (REFLEX): Squamous Epithelial / HPF: NONE SEEN (ref 0–5)

## 2021-03-01 LAB — APTT: aPTT: 29 seconds (ref 24–36)

## 2021-03-01 LAB — RESP PANEL BY RT-PCR (FLU A&B, COVID) ARPGX2
Influenza A by PCR: NEGATIVE
Influenza B by PCR: NEGATIVE
SARS Coronavirus 2 by RT PCR: NEGATIVE

## 2021-03-01 LAB — URINALYSIS, ROUTINE W REFLEX MICROSCOPIC
Bilirubin Urine: NEGATIVE
Glucose, UA: NEGATIVE mg/dL
Hgb urine dipstick: NEGATIVE
Ketones, ur: 5 mg/dL — AB
Leukocytes,Ua: NEGATIVE
Nitrite: NEGATIVE
Protein, ur: 30 mg/dL — AB
Specific Gravity, Urine: >1.03 — ABNORMAL HIGH (ref 1.005–1.030)
pH: 5.5 (ref 5.0–8.0)

## 2021-03-01 LAB — LACTIC ACID, PLASMA
Lactic Acid, Venous: 1.4 mmol/L (ref 0.5–1.9)
Lactic Acid, Venous: 1.5 mmol/L (ref 0.5–1.9)

## 2021-03-01 LAB — PROTIME-INR
INR: 1 (ref 0.8–1.2)
Prothrombin Time: 13.6 seconds (ref 11.4–15.2)

## 2021-03-01 LAB — VALPROIC ACID LEVEL: Valproic Acid Lvl: <10 ug/mL — ABNORMAL LOW (ref 50.0–100.0)

## 2021-03-01 MED ORDER — LABETALOL HCL 5 MG/ML IV SOLN
10.0000 mg | Freq: Once | INTRAVENOUS | Status: AC
  Administered 2021-03-01: 20:00:00 10 mg via INTRAVENOUS
  Filled 2021-03-01: qty 4

## 2021-03-01 MED ORDER — BISACODYL 10 MG RE SUPP
10.0000 mg | Freq: Once | RECTAL | Status: AC
  Administered 2021-03-02: 10 mg via RECTAL
  Filled 2021-03-01: qty 1

## 2021-03-01 MED ORDER — LACTATED RINGERS IV SOLN: INTRAVENOUS | Status: DC

## 2021-03-01 MED ORDER — PANTOPRAZOLE SODIUM 40 MG PO TBEC: 40.0000 mg | DELAYED_RELEASE_TABLET | Freq: Every day | ORAL | Status: DC

## 2021-03-01 MED ORDER — PREDNISONE 2.5 MG PO TABS
2.5000 mg | ORAL_TABLET | Freq: Every day | ORAL | Status: DC
  Filled 2021-03-01: qty 1

## 2021-03-01 MED ORDER — ENOXAPARIN SODIUM 40 MG/0.4ML IJ SOSY
40.0000 mg | PREFILLED_SYRINGE | INTRAMUSCULAR | Status: DC
  Administered 2021-03-02 – 2021-03-03 (×3): 40 mg via SUBCUTANEOUS
  Filled 2021-03-01 (×3): qty 0.4

## 2021-03-01 MED ORDER — SODIUM CHLORIDE 0.9 % IV SOLN
500.0000 mg | INTRAVENOUS | Status: DC
  Administered 2021-03-01 – 2021-03-03 (×3): 500 mg via INTRAVENOUS
  Filled 2021-03-01 (×4): qty 5

## 2021-03-01 MED ORDER — SODIUM CHLORIDE 0.9 % IV SOLN
2.0000 g | INTRAVENOUS | Status: DC
  Administered 2021-03-01 – 2021-03-03 (×3): 2 g via INTRAVENOUS
  Filled 2021-03-01 (×3): qty 20

## 2021-03-01 MED ORDER — IOHEXOL 300 MG/ML  SOLN
100.0000 mL | Freq: Once | INTRAMUSCULAR | Status: AC | PRN
  Administered 2021-03-01: 17:00:00 100 mL via INTRAVENOUS

## 2021-03-01 MED ORDER — HYDRALAZINE HCL 25 MG PO TABS: 50.0000 mg | ORAL_TABLET | Freq: Three times a day (TID) | ORAL | Status: DC

## 2021-03-01 MED ORDER — CLONAZEPAM 0.5 MG PO TABS
0.5000 mg | ORAL_TABLET | Freq: Two times a day (BID) | ORAL | Status: DC
  Filled 2021-03-01: qty 1

## 2021-03-01 MED ORDER — ACETAMINOPHEN 325 MG PO TABS: 650.0000 mg | ORAL_TABLET | Freq: Four times a day (QID) | ORAL | Status: DC | PRN

## 2021-03-01 MED ORDER — CELECOXIB 200 MG PO CAPS
200.0000 mg | ORAL_CAPSULE | Freq: Every day | ORAL | Status: DC
  Filled 2021-03-01: qty 1

## 2021-03-01 MED ORDER — DIAZEPAM 5 MG PO TABS
5.0000 mg | ORAL_TABLET | Freq: Four times a day (QID) | ORAL | Status: DC | PRN
Start: 2021-03-01 — End: 2021-03-01
  Administered 2021-03-01: 20:00:00 10 mg via ORAL
  Filled 2021-03-01: qty 2

## 2021-03-01 MED ORDER — ACETAMINOPHEN 650 MG RE SUPP: 650.0000 mg | Freq: Four times a day (QID) | RECTAL | Status: DC | PRN

## 2021-03-01 MED ORDER — LINACLOTIDE 145 MCG PO CAPS
145.0000 ug | ORAL_CAPSULE | Freq: Every day | ORAL | Status: DC
  Administered 2021-03-01: 20:00:00 145 ug via ORAL
  Filled 2021-03-01: qty 1

## 2021-03-01 MED ORDER — LABETALOL HCL 5 MG/ML IV SOLN
5.0000 mg | INTRAVENOUS | Status: DC | PRN
  Administered 2021-03-02: 02:00:00 5 mg via INTRAVENOUS
  Filled 2021-03-01: qty 4

## 2021-03-01 MED ORDER — CARBIDOPA-LEVODOPA 25-100 MG PO TABS
2.0000 | ORAL_TABLET | Freq: Three times a day (TID) | ORAL | Status: DC
  Filled 2021-03-01: qty 2

## 2021-03-01 MED ORDER — LORATADINE 10 MG PO TABS: 10.0000 mg | ORAL_TABLET | Freq: Every day | ORAL | Status: DC

## 2021-03-01 NOTE — ED Provider Notes (Signed)
This patient is a 70 year old male who unfortunately has some progressive neurologic diseases whether it is parkinsonism or dementia or some combination of the both.  The wife states that he is also being diagnosed with some kind of paranoia based neurologic abnormality.  They have 24-hour around-the-clock care at home however the patient continues to get out of bed and fall, he is not able to stand even for a second by himself and is for the last 5 days been sleeping in a recliner because he cannot get back and forth to the bed.  She has had a very difficult time waking him up the last couple of days, she was advised by his doctors to bring him to the hospital.  They live in [place], she hired a Architectural technologist to bring him here to this hospital across [employer].  On my exam the patient has a tender lower abdomen, nonperitoneal and very soft in the upper abdomen.  He is able to lift both legs off the bed in a very slow and calculated fashion, he is able to squeeze both my hands with his grips and they seem symmetrical.  He possibly has a subtle left-sided facial droop but when he does talk it appears clear and his other cranial nerves appear normal 3 through 12.  His cardiac and pulmonary exams are unremarkable.  Of note he is severely hypertensive at 214/111, evidently recently the patient had been seen and treated for hypotension during which time it was thought that he might be having TIA symptoms or stroke symptoms and ultimately it was due to hypotension.  He is currently on medications for all of the above illnesses but because he tries to chew them the spouse has not been giving them due to them being "timed release".  She is not sure what to do at this point.  He has not had any fevers or vomiting but he does cough occasionally.  He does not have any rashes there is no significant swelling in his legs and he has not had any diarrhea.  Recent UTI about 1 month ago and now his urine is again  dark amber-colored with some cloudiness which is evidenced at the bedside.  Medical screening examination/treatment/procedure(s) were conducted as a shared visit with non-physician practitioner(s) and myself.  I personally evaluated the patient during the encounter.  Clinical Impression:   Final diagnoses:  Community acquired pneumonia, unspecified laterality  Sepsis, due to unspecified organism, unspecified whether acute organ dysfunction present [facility])    EKG Interpretation  Date/Time:  Monday March 01 2021 15:12:01 EST Ventricular Rate:  94 PR Interval:  162 QRS Duration: 92 QT Interval:  348 QTC Calculation: 436 R Axis:   14 Text Interpretation: Sinus rhythm Abnormal R-wave progression, early transition Confirmed by Eber Hong ([id number]) on 03/01/2021 3:40:13 PM             Eber Hong, MD 03/02/21 1527

## 2021-03-01 NOTE — H&P (Signed)
History and Physical    Tyler Fitzpatrick VBT:[id number] DOB: 16-Feb-1951 DOA: 03/01/2021  PCP: Elyn Aquas Patient coming from: Home  Chief Complaint: Altered mental status  HPI: Tyler Fitzpatrick is a 70 y.o. 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  On arrival to the ED, patient significantly hypertensive with systolic in the 977S and tachypneic with respiratory rate in the low 20s.  Not febrile or tachycardic.  Not hypoxic.  Labs showing WBC 14.6.  UA with negative nitrite, negative leukocytes, 11-20 WBCs, and rare bacteria.  Urine culture pending.  COVID and flu negative.  Lactic acid normal x2.  Blood culture x2 drawn.  Valproic acid level pending.  Chest x-ray showing right basilar opacity concerning for pneumonia.  CT head negative for acute finding.  CT abdomen pelvis negative for acute infectious process.  Showing moderate retained stool within the rectal vault. Patient was given IV labetalol, ceftriaxone, azithromycin, and IV fluid.  Patient is confused.  He has no complaints.  Wife and family friend at bedside.  Wife states patient has Parkinson's and dementia and things have been going downhill since the beginning of September.  He is very confused at home and agitated, running away from home and was recently found on the street in cold weather.  States things have escalated to the point where patient's caregiver has quit which is making it very difficult for her to take care of him.  He is refusing his medications and when he does take them he starts chewing on them.  Wife is concerned that some of his Parkinson's medications are extended release formulations and the pharmacist told her that if he  chewed those drugs, he could overdose.  She has managed to continue giving him his blood pressure medications by mixing them in his food.  States sometimes he chokes when eating food or taking medications but was able to eat breakfast today without any problems.  No fevers reported.  Wife did notice that he is coughing lately.  Patient denies shortness of breath.  Per wife, he had 1 episode of diarrhea/loose stools earlier today, no recurrence.  He is not on Linzess for chronic constipation.  No nausea or vomiting.  Patient denies abdominal pain.  Review of Systems:  All systems reviewed and apart from history of presenting illness, are negative.  Past Medical History:  Diagnosis Date   Dementia ([place])    GERD (gastroesophageal reflux disease)    Hypertension    Paranoia ([place])    Parkinson disease ([place]) 03/08/2011   REM sleep behavior disorder    fights in his sleep   Sleep apnea    uses CPAP    Past Surgical History:  Procedure Laterality Date   [place] ARTHROSCOPY Right    [place]     reports that he has quit smoking. His smoking use included cigarettes. His smokeless tobacco use includes chew. He reports that he does not drink alcohol and does not use drugs.  No Known Allergies  History reviewed. No pertinent family history.  Prior to Admission medications   Medication Sig Start  Date End Date Taking? Authorizing Provider  carbidopa-levodopa (SINEMET IR) 25-100 MG per tablet Take 2 tablets by mouth 3 (three) times daily.    [provider]  celecoxib (CELEBREX) 200 MG capsule Take 200 mg by mouth daily.    [provider]  cetirizine (ZYRTEC) 10 MG tablet Take 10 mg by mouth daily.    [provider]  clonazePAM (KLONOPIN) 0.5 MG tablet Take 0.5 mg by mouth 2 (two) times daily.    [provider]  diazepam (VALIUM)  5 MG tablet Take 1-2 tablets (5-10 mg total) by mouth every 6 (six) hours as needed for muscle spasms. 01/08/14   Julio Sicks, MD  Linaclotide (LINZESS) 145 MCG CAPS capsule Take 145 mcg by mouth daily.    [provider]  omeprazole (PRILOSEC) 20 MG capsule Take 20 mg by mouth daily.    [provider]  oxyCODONE-acetaminophen (PERCOCET) 10-325 MG per tablet Take 1-2 tablets by mouth every 4 (four) hours as needed for pain. 01/08/14   Julio Sicks, MD  predniSONE (DELTASONE) 2.5 MG tablet Take 2.5 mg by mouth daily with breakfast.    [provider]    Physical Exam: Vitals:   03/01/21 1454 03/01/21 1745 03/01/21 1800 03/01/21 1830  BP:  (!) 198/112 (!) 182/104 (!) 209/112  Pulse:  92 90 94  Resp:  (!) 23 (!) 25 18  Temp:      SpO2:  97% 98% 98%  Weight: 103 kg     Height: 5\' 9"  (1.753 m)       Physical Exam Constitutional:      General: He is not in acute distress. HENT:     Head: Normocephalic and atraumatic.     Mouth/Throat:     Mouth: Mucous membranes are dry.  Eyes:     Extraocular Movements: Extraocular movements intact.     Conjunctiva/sclera: Conjunctivae normal.  Cardiovascular:     Rate and Rhythm: Normal rate and regular rhythm.     Pulses: Normal pulses.  Pulmonary:     Effort: Pulmonary effort is normal. No respiratory distress.     Breath sounds: No wheezing or rales.  Abdominal:     General: Bowel sounds are normal. There is distension.     Palpations: Abdomen is soft.     Tenderness: There is no abdominal tenderness. There is no guarding or rebound.  Musculoskeletal:     Cervical back: Normal range of motion and neck supple.     Right lower leg: Edema present.     Left lower leg: Edema present.     Comments: +2 pitting edema of bilateral lower extremities  Skin:    General: Skin is warm and dry.  Neurological:     Mental Status: He is alert.     Cranial Nerves: No cranial nerve deficit.     Sensory: No sensory deficit.      Motor: No weakness.     Comments: He knows his name He knows it is December 2022 Not oriented to place Confused     Labs on Admission: I have personally reviewed following labs and imaging studies  CBC: Recent Labs  Lab 03/01/21 1455  WBC 14.6*  NEUTROABS 11.4*  HGB 15.7  HCT 48.7  MCV 95.3  PLT 198   Basic Metabolic Panel: Recent Labs  Lab 03/01/21 1455  NA 141  K 3.7  CL 105  CO2 26  GLUCOSE 106*  BUN 14  CREATININE 0.83  CALCIUM 8.7*  GFR: Estimated Creatinine Clearance: 97.9 mL/min (by C-G formula based on SCr of 0.83 mg/dL). Liver Function Tests: Recent Labs  Lab 03/01/21 1455  AST 24  ALT <5  ALKPHOS 84  BILITOT 0.9  PROT 6.6  ALBUMIN 3.3*   No results for input(s): LIPASE, AMYLASE in the last 168 hours. No results for input(s): AMMONIA in the last 168 hours. Coagulation Profile: Recent Labs  Lab 03/01/21 1833  INR 1.0   Cardiac Enzymes: No results for input(s): CKTOTAL, CKMB, CKMBINDEX, TROPONINI in the last 168 hours. BNP (last 3 results) No results for input(s): PROBNP in the last 8760 hours. HbA1C: No results for input(s): HGBA1C in the last 72 hours. CBG: No results for input(s): GLUCAP in the last 168 hours. Lipid Profile: No results for input(s): CHOL, HDL, LDLCALC, TRIG, CHOLHDL, LDLDIRECT in the last 72 hours. Thyroid Function Tests: No results for input(s): TSH, T4TOTAL, FREET4, T3FREE, THYROIDAB in the last 72 hours. Anemia Panel: No results for input(s): VITAMINB12, FOLATE, FERRITIN, TIBC, IRON, RETICCTPCT in the last 72 hours. Urine analysis:    Component Value Date/Time   COLORURINE AMBER (A) 03/01/2021 1455   APPEARANCEUR CLOUDY (A) 03/01/2021 1455   LABSPEC >1.030 (H) 03/01/2021 1455   PHURINE 5.5 03/01/2021 1455   GLUCOSEU NEGATIVE 03/01/2021 1455   HGBUR NEGATIVE 03/01/2021 1455   BILIRUBINUR NEGATIVE 03/01/2021 1455   KETONESUR 5 (A) 03/01/2021 1455   PROTEINUR 30 (A) 03/01/2021 1455   NITRITE NEGATIVE  03/01/2021 1455   LEUKOCYTESUR NEGATIVE 03/01/2021 1455    Radiological Exams on Admission: CT Head Wo Contrast  Result Date: 03/01/2021 CLINICAL DATA:  Altered mental status EXAM: CT HEAD WITHOUT CONTRAST TECHNIQUE: Contiguous axial images were obtained from the base of the skull through the vertex without intravenous contrast. COMPARISON:  None. FINDINGS: Brain: Left stimulator in place in the region of the thalamus/midbrain. No visible complicating feature. No acute intracranial abnormality. Specifically, no hemorrhage, hydrocephalus, mass lesion, acute infarction, or significant intracranial injury. Vascular: No hyperdense vessel or unexpected calcification. Skull: No acute calvarial abnormality. Sinuses/Orbits: No acute findings Other: None IMPRESSION: No acute intracranial abnormality. Electronically Signed   By: Rolm Baptise M.D.   On: 03/01/2021 16:32   CT ABDOMEN PELVIS W CONTRAST  Result Date: 03/01/2021 CLINICAL DATA:  Central generalized abdominal pain, increasing confusion EXAM: CT ABDOMEN AND PELVIS WITH CONTRAST TECHNIQUE: Multidetector CT imaging of the abdomen and pelvis was performed using the standard protocol following bolus administration of intravenous contrast. CONTRAST:  122mL OMNIPAQUE IOHEXOL 300 MG/ML  SOLN COMPARISON:  None. FINDINGS: Lower chest: Scattered atelectasis or scarring at the lung bases. Aortic valve prosthesis. Calcification of the mitral annulus. Hepatobiliary: No focal liver abnormality is seen. No gallstones, gallbladder wall thickening, or biliary dilatation. Pancreas: Unremarkable. No pancreatic ductal dilatation or surrounding inflammatory changes. Spleen: Normal in size without focal abnormality. Adrenals/Urinary Tract: Adrenal glands are unremarkable. Kidneys are normal, without renal calculi, focal lesion, or hydronephrosis. Bladder is unremarkable. Stomach/Bowel: No bowel obstruction or ileus. Normal appendix right lower quadrant. Scattered  diverticulosis throughout the colon without diverticulitis. Moderate fecal retention within the rectal vault. Vascular/Lymphatic: Aortic atherosclerosis. No enlarged abdominal or pelvic lymph nodes. Reproductive: Prostate is unremarkable. Other: No free fluid or free gas.  No abdominal wall hernia. Musculoskeletal: No acute or destructive bony lesions. Minimal anterolisthesis of L5 on S1, with postsurgical changes from discectomy and posterior fusion at that level. Reconstructed images demonstrate no additional findings. IMPRESSION: 1. No acute intra-abdominal or intrapelvic process. 2. Moderate retained stool  within the rectal vault. Please correlate for any signs of fecal impaction. 3. Scattered colonic diverticulosis without diverticulitis. Electronically Signed   By: Randa Ngo M.D.   On: 03/01/2021 17:26   DG Chest Portable 1 View  Result Date: 03/01/2021 CLINICAL DATA:  Cough EXAM: PORTABLE CHEST 1 VIEW COMPARISON:  None FINDINGS: Left chest wall stimulator battery pack noted. Prior aortic valve repair. Right basilar opacity. Left lung clear. No visible effusions. Heart is normal size. No acute bone abnormality. IMPRESSION: Right basilar airspace opacity concerning for pneumonia. Electronically Signed   By: Rolm Baptise M.D.   On: 03/01/2021 16:29    EKG: Independently reviewed.  Sinus rhythm, no prior tracing for comparison.  Assessment/Plan Principal Problem:   CAP (community acquired pneumonia) Active Problems:   Sepsis ([place])   Parkinson's disease ([place])   Dementia ([place])   Hypertensive urgency   Sepsis secondary to community-acquired pneumonia Chest x-ray showing right basilar opacity.  Met criteria for sepsis with mild tachypnea at the time of presentation to the ED and mild leukocytosis on labs.  No lactic acidosis or hypotension to suggest severe sepsis.  Not hypoxic.  COVID and flu negative.  Wife concerned about intermittent episodes of patient choking on food and  medications. -Continue ceftriaxone, azithromycin, and IV fluid hydration.  Blood cultures pending.  Monitor WBC count.  Supplemental oxygen as needed to keep oxygen saturation above 92%.  Keep n.p.o., SLP eval, aspiration precautions.  Acute encephalopathy Likely multifactorial from acute infection and progression of underlying neurologic disease.  CT head negative for acute finding.  Neuro exam nonfocal. -Continue antibiotics for pneumonia and home medications for Parkinson's/dementia.  Parkinson's disease/dementia REM sleep behavior disorder -Per review of records under care everywhere, he is on Rytary, Klonopin, Seroquel, and Sinemet.  Pharmacy dose verification pending.  Neurology consulted given concern for progression of his neurologic disease.  TOC consulted to help with nursing home placement.  Follow delirium precautions.  Hypertensive urgency Blood pressure elevated with systolic above 008. -IV labetalol prn SBP >180.  Unclear which medications he takes at home, pharmacy med rec pending.  OSA -Continue nightly CPAP  Chronic constipation CT showing moderate retained stool within the rectal vault. -Dulcolax suppository.  Resume home [place] after pharmacy med rec is done.  DVT prophylaxis: Lovenox Code Status: DNR-confirmed with the patient's wife. Family Communication: Wife and family friend at bedside. Disposition Plan: Status is: Observation  The patient remains OBS appropriate and will d/c before 2 midnights.  Level of care: Level of care: Telemetry Medical  The medical decision making on this patient was of high complexity and the patient is at high risk for clinical deterioration, therefore this is a level 3 visit.  Shela Leff MD [facility]  If 7PM-7AM, please contact night-coverage [email or web address]  03/01/2021, 8:16 PM

## 2021-03-01 NOTE — ED Provider Notes (Signed)
[facility] EMERGENCY DEPARTMENT Provider Note   CSN: [id number] Arrival date & time: 03/01/21  1440   LEVEL 5 CAVEAT: DEMENTIA   History Chief Complaint  Patient presents with   Altered Mental Status    Tyler Fitzpatrick is a 70 y.o. male with history of Parkinson's and dementia who presents to the emergency department today for failure to thrive that has been ongoing over the last several months.  Wife is at bedside and provides most of the history. Wife states that things have been going downhill since he fell on her and broke her leg back in September.  She states that he has been increasingly more somnolent, general weak, more combative with nursing staff, and lack of p.o. intake.  She states that he had a urinary tract infection within the last month that was successfully treated.  She does mention has been having associated loose stools and had a large diarrhea episode this morning.  The wife denies any fever, chills, shortness of breath, nausea, vomiting, chest pain.  Wife does mention that he has been coughing up a lot of whitish sputum. He has fallen numerous times over the last several weeks.   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  Patient also does have a deep brain stimulator in the left upper chest wall.   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 No pertinent family history.  Social History   Tobacco Use   Smoking status: Former    Types: Cigarettes   Smokeless tobacco: Current    Types: Chew  Substance Use Topics   Alcohol use: No   Drug use: No    Home Medications Prior to Admission medications   Medication Sig Start Date End Date Taking? Authorizing Provider  carbidopa-levodopa (SINEMET IR) 25-100 MG per tablet Take 2 tablets by mouth 3 (three) times daily.    [provider]  celecoxib (CELEBREX) 200 MG capsule Take 200 mg by mouth daily.    [provider]  cetirizine (ZYRTEC) 10 MG tablet Take 10 mg by mouth daily.    [provider]  clonazePAM (KLONOPIN) 0.5 MG tablet Take 0.5 mg by mouth 2 (two) times daily.    [provider]  diazepam (VALIUM) 5 MG tablet Take 1-2 tablets (5-10 mg total) by mouth every 6 (six) hours as needed for muscle spasms. 01/08/14   Julio Sicks, MD  Linaclotide (LINZESS) 145 MCG CAPS capsule Take 145 mcg by mouth daily.    [provider]  omeprazole (PRILOSEC) 20 MG capsule Take 20 mg by mouth daily.    [provider]  oxyCODONE-acetaminophen (PERCOCET) 10-325 MG per tablet  Take 1-2 tablets by mouth every 4 (four) hours as needed for pain. 01/08/14   Julio Sicks, MD  predniSONE (DELTASONE) 2.5 MG tablet Take 2.5 mg by mouth daily with breakfast.    [provider]    Allergies    Patient has no known allergies.  Review of Systems   Review of Systems  Unable to perform ROS: Dementia   Physical Exam Updated Vital Signs BP (!) 209/112    Pulse 94    Temp 98 F (36.7 C)    Resp 18    Ht 5\' 9"  (1.753 m)    Wt 103 kg    SpO2 98%    BMI 33.53 kg/m   Physical Exam Vitals and nursing note reviewed.  Constitutional:      General: He is not in acute distress.    Appearance: Normal appearance.     Comments: Appears chronically ill.  HENT:     Head: Normocephalic  and atraumatic.  Eyes:     General:        Right eye: No discharge.        Left eye: No discharge.  Cardiovascular:     Comments: Regular rate and rhythm.  S1/S2 are distinct without any evidence of murmur, rubs, or gallops.  Radial pulses are 2+ bilaterally.  Dorsalis pedis pulses are 2+ bilaterally.  Pulmonary:     Comments: Clear to auscultation bilaterally.  Normal effort.  No respiratory distress.  No evidence of wheezes, rales, or rhonchi heard throughout. Chest:     Comments: Well-healed surgical incision on the left upper chest wall. Abdominal:     General: Abdomen is flat. Bowel sounds are normal. There is no distension.     Tenderness: There is abdominal tenderness. There is no guarding or rebound.  Musculoskeletal:        General: Normal range of motion.     Cervical back: Neck supple.     Right lower leg: 2+ Pitting Edema present.     Left lower leg: 2+ Pitting Edema present.  Skin:    General: Skin is warm and dry.     Findings: No rash.  Neurological:     General: No focal deficit present.     Mental Status: He is oriented to person, place, and time. He is lethargic.     Comments: 5/5 strength to the upper and lower extremities.  He does have normal sensation to the upper and lower extremities.  Psychiatric:        Mood and Affect: Mood normal.        Behavior: Behavior normal.    ED Results / Procedures / Treatments   Labs (all labs ordered are listed, but only abnormal results are displayed) Labs Reviewed  COMPREHENSIVE METABOLIC PANEL - Abnormal; Notable for the following components:      Result Value   Glucose, Bld 106 (*)    Calcium 8.7 (*)    Albumin 3.3 (*)    All other components within normal limits  CBC WITH DIFFERENTIAL/PLATELET - Abnormal; Notable for the following components:   WBC 14.6 (*)    Neutro Abs 11.4 (*)    Monocytes Absolute 1.3 (*)    Abs Immature Granulocytes 0.08 (*)    All other components within normal limits  URINALYSIS,  ROUTINE W REFLEX MICROSCOPIC - Abnormal; Notable for the following components:   Color, Urine AMBER (*)    APPearance CLOUDY (*)    Specific Gravity, Urine >1.030 (*)  Ketones, ur 5 (*)    Protein, ur 30 (*)    All other components within normal limits  VALPROIC ACID LEVEL - Abnormal; Notable for the following components:   Valproic Acid Lvl <10 (*)    All other components within normal limits  URINALYSIS, MICROSCOPIC (REFLEX) - Abnormal; Notable for the following components:   Bacteria, UA RARE (*)    All other components within normal limits  RESP PANEL BY RT-PCR (FLU A&B, COVID) ARPGX2  URINE CULTURE  CULTURE, BLOOD (ROUTINE X 2)  CULTURE, BLOOD (ROUTINE X 2)  LACTIC ACID, PLASMA  LACTIC ACID, PLASMA  PROTIME-INR  APTT  CBG MONITORING, ED    EKG EKG Interpretation  Date/Time:  Monday March 01 2021 15:12:01 EST Ventricular Rate:  94 PR Interval:  162 QRS Duration: 92 QT Interval:  348 QTC Calculation: 436 R Axis:   14 Text Interpretation: Sinus rhythm Abnormal R-wave progression, early transition Confirmed by Eber Hong ([id number]) on 03/01/2021 3:40:13 PM  Radiology CT Head Wo Contrast  Result Date: 03/01/2021 CLINICAL DATA:  Altered mental status EXAM: CT HEAD WITHOUT CONTRAST TECHNIQUE: Contiguous axial images were obtained from the base of the skull through the vertex without intravenous contrast. COMPARISON:  None. FINDINGS: Brain: Left stimulator in place in the region of the thalamus/midbrain. No visible complicating feature. No acute intracranial abnormality. Specifically, no hemorrhage, hydrocephalus, mass lesion, acute infarction, or significant intracranial injury. Vascular: No hyperdense vessel or unexpected calcification. Skull: No acute calvarial abnormality. Sinuses/Orbits: No acute findings Other: None IMPRESSION: No acute intracranial abnormality. Electronically Signed   By: Charlett Nose M.D.   On: 03/01/2021 16:32   CT ABDOMEN PELVIS W  CONTRAST  Result Date: 03/01/2021 CLINICAL DATA:  Central generalized abdominal pain, increasing confusion EXAM: CT ABDOMEN AND PELVIS WITH CONTRAST TECHNIQUE: Multidetector CT imaging of the abdomen and pelvis was performed using the standard protocol following bolus administration of intravenous contrast. CONTRAST:  OMNIPAQUE IOHEXOL 300 MG/ML  SOLN COMPARISON:  None. FINDINGS: Lower chest: Scattered atelectasis or scarring at the lung bases. Aortic valve prosthesis. Calcification of the mitral annulus. Hepatobiliary: No focal liver abnormality is seen. No gallstones, gallbladder wall thickening, or biliary dilatation. Pancreas: Unremarkable. No pancreatic ductal dilatation or surrounding inflammatory changes. Spleen: Normal in size without focal abnormality. Adrenals/Urinary Tract: Adrenal glands are unremarkable. Kidneys are normal, without renal calculi, focal lesion, or hydronephrosis. Bladder is unremarkable. Stomach/Bowel: No bowel obstruction or ileus. Normal appendix right lower quadrant. Scattered diverticulosis throughout the colon without diverticulitis. Moderate fecal retention within the rectal vault. Vascular/Lymphatic: Aortic atherosclerosis. No enlarged abdominal or pelvic lymph nodes. Reproductive: Prostate is unremarkable. Other: No free fluid or free gas.  No abdominal wall hernia. Musculoskeletal: No acute or destructive bony lesions. Minimal anterolisthesis of L5 on S1, with postsurgical changes from discectomy and posterior fusion at that level. Reconstructed images demonstrate no additional findings. IMPRESSION: 1. No acute intra-abdominal or intrapelvic process. 2. Moderate retained stool within the rectal vault. Please correlate for any signs of fecal impaction. 3. Scattered colonic diverticulosis without diverticulitis. Electronically Signed   By: Sharlet Salina M.D.   On: 03/01/2021 17:26   DG Chest Portable 1 View  Result Date: 03/01/2021 CLINICAL DATA:  Cough EXAM:  PORTABLE CHEST 1 VIEW COMPARISON:  None FINDINGS: Left chest wall stimulator battery pack noted. Prior aortic valve repair. Right basilar opacity. Left lung clear. No visible effusions. Heart is normal size. No acute bone abnormality. IMPRESSION: Right basilar airspace opacity concerning for pneumonia. Electronically Signed  By: Charlett Nose M.D.   On: 03/01/2021 16:29    Procedures .Critical Care Performed by: Teressa Lower, PA-C Authorized by: Teressa Lower, PA-C   Critical care provider statement:    Critical care time (minutes):  35   Critical care time was exclusive of:  Separately billable procedures and treating other patients   Critical care was necessary to treat or prevent imminent or life-threatening deterioration of the following conditions:  Sepsis   Critical care was time spent personally by me on the following activities:  Ordering and performing treatments and interventions, ordering and review of laboratory studies, ordering and review of radiographic studies, pulse oximetry, re-evaluation of patient's condition, review of old charts and examination of patient   I assumed direction of critical care for this patient from another provider in my specialty: no     Care discussed with: admitting provider     Medications Ordered in ED Medications  lactated ringers infusion ( Intravenous New Bag/Given 03/01/21 1831)  cefTRIAXone (ROCEPHIN) 2 g in sodium chloride 0.9 % 100 mL IVPB (2 g Intravenous New Bag/Given 03/01/21 1831)  azithromycin (ZITHROMAX) 500 mg in sodium chloride 0.9 % 250 mL IVPB (500 mg Intravenous New Bag/Given 03/01/21 1832)  carbidopa-levodopa (SINEMET IR) 25-100 MG per tablet immediate release 2 tablet (has no administration in time range)  celecoxib (CELEBREX) capsule 200 mg (has no administration in time range)  loratadine (CLARITIN) tablet 10 mg (has no administration in time range)  clonazePAM (KLONOPIN) tablet 0.5 mg (has no administration in time  range)  diazepam (VALIUM) tablet 5-10 mg (10 mg Oral Given 03/01/21 1959)  linaclotide (LINZESS) capsule 145 mcg (145 mcg Oral Given 03/01/21 1959)  pantoprazole (PROTONIX) EC tablet 40 mg (has no administration in time range)  predniSONE (DELTASONE) tablet 2.5 mg (has no administration in time range)  iohexol (OMNIPAQUE) 300 MG/ML solution 100 mL (100 mLs Intravenous Contrast Given 03/01/21 1704)  labetalol (NORMODYNE) injection 10 mg (10 mg Intravenous Given 03/01/21 1944)    ED Course  I have reviewed the triage vital signs and the nursing notes.  Pertinent labs & imaging results that were available during my care of the patient were reviewed by me and considered in my medical decision making (see chart for details).  Clinical Course as of 03/01/21 2005  Mon Mar 01, 2021  1541 I discussed this case with my attending physician who cosigned this note including patient's presenting symptoms, physical exam, and planned diagnostics and interventions. Attending physician stated agreement with plan or made changes to plan which were implemented.   Attending physician assessed patient at bedside.   [CF]  1843 I spoke with Dr. Ulyess Blossom who agrees to admit the patient.  [CF]    Clinical Course User Index [CF] Jolyn Lent   MDM Rules/Calculators/A&P                          FINNEUS KANESHIRO is a 70 y.o. male who presents to the emergency department with increasing ultimately status, generalized weakness, and failure to thrive.  Speaking with the wife, she states that she is having a hard time taking care of him at home.  He has been increasingly more lethargic over the last several days.  At this point is unclear what is causing this increased lethargy and altered mental status and just failure to thrive.  We will get labs, Depakote level, respiratory panel, will scan his brain.  I  will get imaging of his abdomen as he was tender on my exam in addition to a chest x-ray.  He is noted  to be hypertensive but in no acute distress at this time.  CBC shows leukocytosis but is otherwise normal.  CMP showed elevated glucose was otherwise normal.  COVID and flu were negative.  Urinalysis did not reveal any signs of infection.  CT head was negative.  Chest x-ray showed pneumonia.  This is likely the cause of his lethargy.  CT abdomen was negative apart from some retained stool burden.  I have a low suspicion at this time for acute emergent intra abdominal pathology.  Urine culture pending.  Valproic acid level pending.  Initial lactic was negative.  Given that the patient was tachypneic and had a heart rate over 90 he did meet sepsis criteria with pneumonia source.  Sepsis protocol was initiated.  Additional labs were ordered including PT/INR, APTT, blood cultures.  Started the patient on ceftriaxone and azithromycin.  Lactated Ringer's were initiated per sepsis protocol.  Labetalol was given for blood pressure control. Given the clinical scenario, I believe he would benefit from further evaluation in the hospital.  I will admit him to the hospitalist service.     Final Clinical Impression(s) / ED Diagnoses Final diagnoses:  Community acquired pneumonia, unspecified laterality  Sepsis, due to unspecified organism, unspecified whether acute organ dysfunction present [facility])    Rx / DC Orders ED Discharge Orders     None        Jolyn Lent 03/01/21 2005    Eber Hong, MD 03/02/21 1527

## 2021-03-01 NOTE — ED Notes (Signed)
Admitting provider at bedside.

## 2021-03-01 NOTE — ED Notes (Signed)
Pt states he has developed 6/10 left sided chest tightness radiating down left arm. Pt denies having this kind of pain before. EKG captured. LS wheezes and rhonchi throughout with  increased WOB currently. MD Allena Katz made aware

## 2021-03-01 NOTE — Sepsis Progress Note (Signed)
Monitoring for the code sepsis protocol. °

## 2021-03-01 NOTE — ED Notes (Signed)
Pt sleeping in bed with family at bedside. NAD. Per family, pt has had a decline in cognition over the last month, particularly the last week. Pt was started on depakote and xanax 1 week ago. Pt is baseline, a/ox1. Pt arrousable and converses basically.

## 2021-03-01 NOTE — ED Triage Notes (Signed)
Pt to ER via EMS from home with c/o "strange behavior".  Reports of increasing confusion and odd behavior for last month.  Started on xanax and depakote in last 2 weeks and pt has not been eating and drinking much, had been getting more agitated and has been biting people.  Pt arrives alert and oriented to person.

## 2021-03-02 DIAGNOSIS — Z20822 Contact with and (suspected) exposure to covid-19: Secondary | ICD-10-CM | POA: Diagnosis present

## 2021-03-02 DIAGNOSIS — Z952 Presence of prosthetic heart valve: Secondary | ICD-10-CM | POA: Diagnosis not present

## 2021-03-02 DIAGNOSIS — N179 Acute kidney failure, unspecified: Secondary | ICD-10-CM | POA: Diagnosis present

## 2021-03-02 DIAGNOSIS — J690 Pneumonitis due to inhalation of food and vomit: Secondary | ICD-10-CM | POA: Diagnosis present

## 2021-03-02 DIAGNOSIS — G4733 Obstructive sleep apnea (adult) (pediatric): Secondary | ICD-10-CM | POA: Diagnosis present

## 2021-03-02 DIAGNOSIS — J189 Pneumonia, unspecified organism: Secondary | ICD-10-CM | POA: Diagnosis present

## 2021-03-02 DIAGNOSIS — G20 Parkinson's disease: Secondary | ICD-10-CM | POA: Diagnosis present

## 2021-03-02 DIAGNOSIS — I5021 Acute systolic (congestive) heart failure: Secondary | ICD-10-CM | POA: Diagnosis not present

## 2021-03-02 DIAGNOSIS — F02818 Dementia in other diseases classified elsewhere, unspecified severity, with other behavioral disturbance: Secondary | ICD-10-CM

## 2021-03-02 DIAGNOSIS — Z66 Do not resuscitate: Secondary | ICD-10-CM | POA: Diagnosis present

## 2021-03-02 DIAGNOSIS — E669 Obesity, unspecified: Secondary | ICD-10-CM | POA: Diagnosis present

## 2021-03-02 DIAGNOSIS — Z720 Tobacco use: Secondary | ICD-10-CM | POA: Diagnosis not present

## 2021-03-02 DIAGNOSIS — G4752 REM sleep behavior disorder: Secondary | ICD-10-CM | POA: Diagnosis present

## 2021-03-02 DIAGNOSIS — R627 Adult failure to thrive: Secondary | ICD-10-CM | POA: Diagnosis present

## 2021-03-02 DIAGNOSIS — I110 Hypertensive heart disease with heart failure: Secondary | ICD-10-CM | POA: Diagnosis present

## 2021-03-02 DIAGNOSIS — F02C18 Dementia in other diseases classified elsewhere, severe, with other behavioral disturbance: Secondary | ICD-10-CM | POA: Diagnosis present

## 2021-03-02 DIAGNOSIS — F22 Delusional disorders: Secondary | ICD-10-CM | POA: Diagnosis present

## 2021-03-02 DIAGNOSIS — A419 Sepsis, unspecified organism: Secondary | ICD-10-CM | POA: Diagnosis present

## 2021-03-02 DIAGNOSIS — Z9689 Presence of other specified functional implants: Secondary | ICD-10-CM | POA: Diagnosis present

## 2021-03-02 DIAGNOSIS — G9341 Metabolic encephalopathy: Secondary | ICD-10-CM | POA: Diagnosis present

## 2021-03-02 DIAGNOSIS — I160 Hypertensive urgency: Secondary | ICD-10-CM | POA: Diagnosis present

## 2021-03-02 DIAGNOSIS — K5909 Other constipation: Secondary | ICD-10-CM | POA: Diagnosis present

## 2021-03-02 DIAGNOSIS — I700 Atherosclerosis of aorta: Secondary | ICD-10-CM | POA: Diagnosis present

## 2021-03-02 DIAGNOSIS — Z7952 Long term (current) use of systemic steroids: Secondary | ICD-10-CM | POA: Diagnosis not present

## 2021-03-02 DIAGNOSIS — K219 Gastro-esophageal reflux disease without esophagitis: Secondary | ICD-10-CM | POA: Diagnosis present

## 2021-03-02 DIAGNOSIS — R4182 Altered mental status, unspecified: Secondary | ICD-10-CM | POA: Diagnosis present

## 2021-03-02 DIAGNOSIS — I959 Hypotension, unspecified: Secondary | ICD-10-CM | POA: Diagnosis not present

## 2021-03-02 LAB — CBC
HCT: 47.2 % (ref 39.0–52.0)
Hemoglobin: 15.8 g/dL (ref 13.0–17.0)
MCH: 31.5 pg (ref 26.0–34.0)
MCHC: 33.5 g/dL (ref 30.0–36.0)
MCV: 94.2 fL (ref 80.0–100.0)
Platelets: 196 10*3/uL (ref 150–400)
RBC: 5.01 MIL/uL (ref 4.22–5.81)
RDW: 13.2 % (ref 11.5–15.5)
WBC: 13.1 10*3/uL — ABNORMAL HIGH (ref 4.0–10.5)
nRBC: 0 % (ref 0.0–0.2)

## 2021-03-02 LAB — URINE CULTURE: Culture: NO GROWTH

## 2021-03-02 LAB — BRAIN NATRIURETIC PEPTIDE: B Natriuretic Peptide: 93.1 pg/mL (ref 0.0–100.0)

## 2021-03-02 LAB — HIV ANTIBODY (ROUTINE TESTING W REFLEX): HIV Screen 4th Generation wRfx: NONREACTIVE

## 2021-03-02 MED ORDER — PRAVASTATIN SODIUM 40 MG PO TABS
40.0000 mg | ORAL_TABLET | Freq: Every day | ORAL | Status: DC
  Administered 2021-03-02 – 2021-03-03 (×2): 40 mg via ORAL
  Filled 2021-03-02 (×2): qty 1

## 2021-03-02 MED ORDER — DONEPEZIL HCL 10 MG PO TABS
10.0000 mg | ORAL_TABLET | Freq: Every day | ORAL | Status: DC
  Administered 2021-03-02 – 2021-03-03 (×2): 10 mg via ORAL
  Filled 2021-03-02 (×2): qty 1

## 2021-03-02 MED ORDER — PANTOPRAZOLE SODIUM 40 MG PO TBEC
40.0000 mg | DELAYED_RELEASE_TABLET | Freq: Every day | ORAL | Status: DC
  Administered 2021-03-02 – 2021-03-03 (×2): 40 mg via ORAL
  Filled 2021-03-02 (×2): qty 1

## 2021-03-02 MED ORDER — QUETIAPINE FUMARATE 100 MG PO TABS: 100.0000 mg | ORAL_TABLET | Freq: Every day | ORAL | Status: DC

## 2021-03-02 MED ORDER — HALOPERIDOL LACTATE 5 MG/ML IJ SOLN
2.0000 mg | Freq: Four times a day (QID) | INTRAMUSCULAR | Status: DC | PRN
Start: 2021-03-02 — End: 2021-03-02
  Administered 2021-03-02: 01:00:00 2 mg via INTRAVENOUS
  Filled 2021-03-02: qty 1

## 2021-03-02 MED ORDER — LATANOPROST 0.005 % OP SOLN
1.0000 [drp] | Freq: Every day | OPHTHALMIC | Status: DC
  Administered 2021-03-04: 03:00:00 1 [drp] via OPHTHALMIC
  Filled 2021-03-02 (×2): qty 2.5

## 2021-03-02 MED ORDER — ALPRAZOLAM 0.5 MG PO TABS
0.5000 mg | ORAL_TABLET | Freq: Three times a day (TID) | ORAL | Status: DC
  Administered 2021-03-02 – 2021-03-03 (×4): 0.5 mg via ORAL
  Filled 2021-03-02: qty 2
  Filled 2021-03-02 (×3): qty 1

## 2021-03-02 MED ORDER — LABETALOL HCL 5 MG/ML IV SOLN
10.0000 mg | INTRAVENOUS | Status: DC | PRN
Start: 2021-03-02 — End: 2021-03-04
  Administered 2021-03-02: 03:00:00 15 mg via INTRAVENOUS
  Administered 2021-03-02 (×2): 20 mg via INTRAVENOUS
  Filled 2021-03-02 (×3): qty 4

## 2021-03-02 MED ORDER — METOPROLOL SUCCINATE ER 25 MG PO TB24
25.0000 mg | ORAL_TABLET | Freq: Two times a day (BID) | ORAL | Status: DC
  Administered 2021-03-02 – 2021-03-03 (×3): 25 mg via ORAL
  Filled 2021-03-02 (×3): qty 1

## 2021-03-02 MED ORDER — DIVALPROEX SODIUM ER 250 MG PO TB24
250.0000 mg | ORAL_TABLET | Freq: Two times a day (BID) | ORAL | Status: DC
  Administered 2021-03-02 – 2021-03-03 (×3): 250 mg via ORAL
  Filled 2021-03-02 (×7): qty 1

## 2021-03-02 MED ORDER — LISINOPRIL 20 MG PO TABS
20.0000 mg | ORAL_TABLET | Freq: Two times a day (BID) | ORAL | Status: DC
  Administered 2021-03-02 (×2): 20 mg via ORAL
  Filled 2021-03-02 (×2): qty 1

## 2021-03-02 MED ORDER — CARBIDOPA-LEVODOPA 25-100 MG PO TABS
2.0000 | ORAL_TABLET | Freq: Four times a day (QID) | ORAL | Status: DC
  Administered 2021-03-02 – 2021-03-04 (×8): 2 via ORAL
  Filled 2021-03-02 (×9): qty 2

## 2021-03-02 MED ORDER — FUROSEMIDE 10 MG/ML IJ SOLN
40.0000 mg | Freq: Once | INTRAMUSCULAR | Status: AC
  Administered 2021-03-02: 16:00:00 40 mg via INTRAVENOUS
  Filled 2021-03-02: qty 4

## 2021-03-02 MED ORDER — QUETIAPINE FUMARATE 100 MG PO TABS
100.0000 mg | ORAL_TABLET | Freq: Three times a day (TID) | ORAL | Status: DC
  Administered 2021-03-02 – 2021-03-03 (×5): 100 mg via ORAL
  Filled 2021-03-02 (×6): qty 1

## 2021-03-02 NOTE — Progress Notes (Signed)
pt will not be wearing CPAP tonight due to pt having mitts on.  Spoke with pt's RN and wife about this. No distress noted at this time.

## 2021-03-02 NOTE — Progress Notes (Signed)
SLP Cancellation Note  Patient Details Name: Tyler Fitzpatrick MRN: [id number] DOB: 07/21/1950   Cancelled treatment:       Reason Eval/Treat Not Completed: Patient at procedure or test/unavailable.  Pt is currently off the floor.  SLP will f/u for bedside swallow evaluation as schedule allows.    Shanon Rosser Delayna Sparlin 03/02/2021, 2:39 PM

## 2021-03-02 NOTE — Progress Notes (Signed)
PROGRESS NOTE    Tyler Fitzpatrick  UXN:[id number] DOB: 01-Apr-1950 DOA: 03/01/2021 PCP: Joya Salm   Chief Complain: Altered mental status, deconditioning  Brief Narrative: Patient is a 70 year old male with history of severe dementia secondary to Parkinson's disease status post brain stimulator, hypertension, GERD, aortic valve stenosis status post TAVR who was brought to the emergency department from home by his wife for the evaluation of rapid decline, increased confusion from baseline, agitation, failure to thrive.  On condition he was hypotensive.  Lab work showed elevated leukocytes.  CT showed right basilar opacity concerning for pneumonia.  Started on antibiotics for community-acquired pneumonia.  Due to his rapid decline, initially dementia, wife is interested on pursuing hospice care at home.  TOC, palliative care consulted  Assessment & Plan:   Principal Problem:   CAP (community acquired pneumonia) Active Problems:   Sepsis (HCC)   Parkinson's disease (HCC)   Dementia (HCC)   Hypertensive urgency   AMS (altered mental status)   Acute metabolic encephalopathy: Found to be more confused, decreased oral intake, decreased activity from baseline.  Likely multifactorial from pneumonia versus progression of neurological disease.  CT head negative for acute findings.  Delirium precautions.  Community-acquired pneumonia: Chest CT showed right basilar opacity.  Presented with tachypnea, leukocytosis.  Patient not hypoxic, on room air.  COVID/flu test negative.  Started on ceftriaxone at admission.  Continue gentle IV fluids.  Possibility of aspiration pneumonia.  Speech therapy consulted continue clear liquid diet for now  End-stage dementia/Parkinson's disease: Follows with neurologist at [facility].  Status post deep brain stimulation.  Confused at baseline.  Can ambulate short distances but lying in the bed most of the time.  Being taken care of at home by wife.  Significant decline  since last 2 weeks with inability to ambulate, poor oral intake, more confused. Started on Sinemet as per neurology.  He is on high-dose Seroquel at home, Depakote, Xanax: Restarted.  Hypertensive urgency: Consistently hypertensive.  Blood pressure better after restarting his home medications.  OSA: Previously on CPAP at home  Constipation: Continue bowel regimen  Bilateral lower extremity edema: We will check BNP, will give a dose of Lasix. Check echo,no echo on file  Goals of care: Extensive discussion done with the wife at the bedside about goals of care.  She is not able to take care of him anymore.  She has been trying to set of hospice care for him either at home or even at residential hospice.  Goal is for comfort but will continue antibiotics in order to treat for pneumonia.  I have put palliative care care consultation, TOC consultation to set up hospice care at least at home.  If he declines, wife agreeable for residential hospice           DVT prophylaxis:Lovenox Code Status: DNR Family Communication: Wife at bedside Patient status:  Dispo: The patient is from: Home              Anticipated d/c is to: Home with Hospice vs residential hospice              Anticipated d/c date is: unsure at this point  Consultants: None  Procedures: None  Antimicrobials:  Anti-infectives (From admission, onward)    Start     Dose/Rate Route Frequency Ordered Stop   03/01/21 1745  cefTRIAXone (ROCEPHIN) 2 g in sodium chloride 0.9 % 100 mL IVPB        2 g 200 mL/hr over 30 Minutes  Intravenous Every 24 hours 03/01/21 1744 03/06/21 1744   03/01/21 1745  azithromycin (ZITHROMAX) 500 mg in sodium chloride 0.9 % 250 mL IVPB        500 mg 250 mL/hr over 60 Minutes Intravenous Every 24 hours 03/01/21 1744 03/06/21 1744       Subjective: Patient seen and examined at the bedside this morning.  Hypertensive during my evaluation, lying on bed, awake, alert but confused.  Not agitated.   Trying to have bowel movement.  Wife at bedside.    Objective: Vitals:   03/02/21 0630 03/02/21 0900 03/02/21 1000 03/02/21 1130  BP: (!) 174/101 (!) 161/113 (!) 192/111 (!) 126/91  Pulse: 87 89 88 93  Resp: 18 18 18  (!) 21  Temp:      SpO2: 96% 97% 96% 95%  Weight:      Height:       No intake or output data in the 24 hours ending 03/02/21 1318 Filed Weights   03/01/21 1454  Weight: 103 kg    Examination:  General exam: Very deconditioned, chronically ill looking, obese  HEENT: PERRL Respiratory system:  no clear wheezes or crackles  Cardiovascular system: S1 & S2 heard, RRR.  Gastrointestinal system: Abdomen is nondistended, soft and nontender. Central nervous system: Alert and awake but not oriented Extremities: B/L LE edema, no clubbing ,no cyanosis Skin: No rashes, no obvious ulcers,no icterus      Data Reviewed: I have personally reviewed following labs and imaging studies  CBC: Recent Labs  Lab 03/01/21 1455 03/02/21 0517  WBC 14.6* 13.1*  NEUTROABS 11.4*  --   HGB 15.7 15.8  HCT 48.7 47.2  MCV 95.3 94.2  PLT 198 196   Basic Metabolic Panel: Recent Labs  Lab 03/01/21 1455  NA 141  K 3.7  CL 105  CO2 26  GLUCOSE 106*  BUN 14  CREATININE 0.83  CALCIUM 8.7*   GFR: Estimated Creatinine Clearance: 97.9 mL/min (by C-G formula based on SCr of 0.83 mg/dL). Liver Function Tests: Recent Labs  Lab 03/01/21 1455  AST 24  ALT <5  ALKPHOS 84  BILITOT 0.9  PROT 6.6  ALBUMIN 3.3*   No results for input(s): LIPASE, AMYLASE in the last 168 hours. No results for input(s): AMMONIA in the last 168 hours. Coagulation Profile: Recent Labs  Lab 03/01/21 1833  INR 1.0   Cardiac Enzymes: No results for input(s): CKTOTAL, CKMB, CKMBINDEX, TROPONINI in the last 168 hours. BNP (last 3 results) No results for input(s): PROBNP in the last 8760 hours. HbA1C: No results for input(s): HGBA1C in the last 72 hours. CBG: No results for input(s): GLUCAP in the  last 168 hours. Lipid Profile: No results for input(s): CHOL, HDL, LDLCALC, TRIG, CHOLHDL, LDLDIRECT in the last 72 hours. Thyroid Function Tests: No results for input(s): TSH, T4TOTAL, FREET4, T3FREE, THYROIDAB in the last 72 hours. Anemia Panel: No results for input(s): VITAMINB12, FOLATE, FERRITIN, TIBC, IRON, RETICCTPCT in the last 72 hours. Sepsis Labs: Recent Labs  Lab 03/01/21 1754 03/01/21 1833  LATICACIDVEN 1.4 1.5    Recent Results (from the past 240 hour(s))  Resp Panel by RT-PCR (Flu A&B, Covid) Nasopharyngeal Swab     Status: None   Collection Time: 03/01/21  3:45 PM   Specimen: Nasopharyngeal Swab; Nasopharyngeal(NP) swabs in vial transport medium  Result Value Ref Range Status   SARS Coronavirus 2 by RT PCR NEGATIVE NEGATIVE Final    Comment: (NOTE) SARS-CoV-2 target nucleic acids are NOT DETECTED.  The  SARS-CoV-2 RNA is generally detectable in upper respiratory specimens during the acute phase of infection. The lowest concentration of SARS-CoV-2 viral copies this assay can detect is 138 copies/mL. A negative result does not preclude SARS-Cov-2 infection and should not be used as the sole basis for treatment or other patient management decisions. A negative result may occur with  improper specimen collection/handling, submission of specimen other than nasopharyngeal swab, presence of viral mutation(s) within the areas targeted by this assay, and inadequate number of viral copies(<138 copies/mL). A negative result must be combined with clinical observations, patient history, and epidemiological information. The expected result is Negative.  Fact Sheet for Patients:  BloggerCourse.com  Fact Sheet for Healthcare Providers:  SeriousBroker.it  This test is no t yet approved or cleared by the [place] FDA and  has been authorized for detection and/or diagnosis of SARS-CoV-2 by FDA under an Emergency Use  Authorization (EUA). This EUA will remain  in effect (meaning this test can be used) for the duration of the COVID-19 declaration under Section 564(b)(1) of the Act, 21 U.S.C.section 360bbb-3(b)(1), unless the authorization is terminated  or revoked sooner.       Influenza A by PCR NEGATIVE NEGATIVE Final   Influenza B by PCR NEGATIVE NEGATIVE Final    Comment: (NOTE) The Xpert Xpress SARS-CoV-2/FLU/RSV plus assay is intended as an aid in the diagnosis of influenza from Nasopharyngeal swab specimens and should not be used as a sole basis for treatment. Nasal washings and aspirates are unacceptable for Xpert Xpress SARS-CoV-2/FLU/RSV testing.  Fact Sheet for Patients: BloggerCourse.com  Fact Sheet for Healthcare Providers: SeriousBroker.it  This test is not yet approved or cleared by the [place] FDA and has been authorized for detection and/or diagnosis of SARS-CoV-2 by FDA under an Emergency Use Authorization (EUA). This EUA will remain in effect (meaning this test can be used) for the duration of the COVID-19 declaration under Section 564(b)(1) of the Act, 21 U.S.C. section 360bbb-3(b)(1), unless the authorization is terminated or revoked.  Performed at [facility], [address] [place], [place] [address]   Urine Culture     Status: None   Collection Time: 03/01/21  3:54 PM   Specimen: Urine, Clean Catch  Result Value Ref Range Status   Specimen Description URINE, CLEAN CATCH  Final   Special Requests NONE  Final   Culture   Final    NO GROWTH Performed at [facility], [address], [place], [place] [address]    Report Status 03/02/2021 FINAL  Final  Blood Culture (routine x 2)     Status: None (Preliminary result)   Collection Time: 03/01/21  6:30 PM   Specimen: BLOOD RIGHT WRIST  Result Value Ref Range Status   Specimen Description BLOOD RIGHT WRIST  Final   Special Requests   Final     BOTTLES DRAWN AEROBIC AND ANAEROBIC Blood Culture results may not be optimal due to an inadequate volume of blood received in culture bottles   Culture   Final    NO GROWTH < 12 HOURS Performed at [facility], [address] [place], [place] [address]    Report Status PENDING  Incomplete  Blood Culture (routine x 2)     Status: None (Preliminary result)   Collection Time: 03/01/21  6:34 PM   Specimen: BLOOD RIGHT HAND  Result Value Ref Range Status   Specimen Description BLOOD RIGHT HAND  Final   Special Requests   Final  BOTTLES DRAWN AEROBIC AND ANAEROBIC Blood Culture adequate volume   Culture   Final    NO GROWTH < 12 HOURS Performed at [facility], [address] [place], [place] [address]    Report Status PENDING  Incomplete         Radiology Studies: CT Head Wo Contrast  Result Date: 03/01/2021 CLINICAL DATA:  Altered mental status EXAM: CT HEAD WITHOUT CONTRAST TECHNIQUE: Contiguous axial images were obtained from the base of the skull through the vertex without intravenous contrast. COMPARISON:  None. FINDINGS: Brain: Left stimulator in place in the region of the thalamus/midbrain. No visible complicating feature. No acute intracranial abnormality. Specifically, no hemorrhage, hydrocephalus, mass lesion, acute infarction, or significant intracranial injury. Vascular: No hyperdense vessel or unexpected calcification. Skull: No acute calvarial abnormality. Sinuses/Orbits: No acute findings Other: None IMPRESSION: No acute intracranial abnormality. Electronically Signed   By: Charlett Nose M.D.   On: 03/01/2021 16:32   CT ABDOMEN PELVIS W CONTRAST  Result Date: 03/01/2021 CLINICAL DATA:  Central generalized abdominal pain, increasing confusion EXAM: CT ABDOMEN AND PELVIS WITH CONTRAST TECHNIQUE: Multidetector CT imaging of the abdomen and pelvis was performed using the standard protocol following bolus administration of intravenous contrast. CONTRAST:   OMNIPAQUE IOHEXOL 300 MG/ML  SOLN COMPARISON:  None. FINDINGS: Lower chest: Scattered atelectasis or scarring at the lung bases. Aortic valve prosthesis. Calcification of the mitral annulus. Hepatobiliary: No focal liver abnormality is seen. No gallstones, gallbladder wall thickening, or biliary dilatation. Pancreas: Unremarkable. No pancreatic ductal dilatation or surrounding inflammatory changes. Spleen: Normal in size without focal abnormality. Adrenals/Urinary Tract: Adrenal glands are unremarkable. Kidneys are normal, without renal calculi, focal lesion, or hydronephrosis. Bladder is unremarkable. Stomach/Bowel: No bowel obstruction or ileus. Normal appendix right lower quadrant. Scattered diverticulosis throughout the colon without diverticulitis. Moderate fecal retention within the rectal vault. Vascular/Lymphatic: Aortic atherosclerosis. No enlarged abdominal or pelvic lymph nodes. Reproductive: Prostate is unremarkable. Other: No free fluid or free gas.  No abdominal wall hernia. Musculoskeletal: No acute or destructive bony lesions. Minimal anterolisthesis of L5 on S1, with postsurgical changes from discectomy and posterior fusion at that level. Reconstructed images demonstrate no additional findings. IMPRESSION: 1. No acute intra-abdominal or intrapelvic process. 2. Moderate retained stool within the rectal vault. Please correlate for any signs of fecal impaction. 3. Scattered colonic diverticulosis without diverticulitis. Electronically Signed   By: Sharlet Salina M.D.   On: 03/01/2021 17:26   DG Chest Portable 1 View  Result Date: 03/01/2021 CLINICAL DATA:  Cough EXAM: PORTABLE CHEST 1 VIEW COMPARISON:  None FINDINGS: Left chest wall stimulator battery pack noted. Prior aortic valve repair. Right basilar opacity. Left lung clear. No visible effusions. Heart is normal size. No acute bone abnormality. IMPRESSION: Right basilar airspace opacity concerning for pneumonia. Electronically Signed   By:  Charlett Nose M.D.   On: 03/01/2021 16:29        Scheduled Meds:  ALPRAZolam  0.5 mg Oral TID   carbidopa-levodopa  2 tablet Oral QID   divalproex  250 mg Oral BID   donepezil  10 mg Oral QHS   enoxaparin (LOVENOX) injection  40 mg Subcutaneous Q24H   latanoprost  1 drop Both Eyes QHS   lisinopril  20 mg Oral BID   metoprolol succinate  25 mg Oral BID   pantoprazole  40 mg Oral Daily   pravastatin  40 mg Oral Daily   QUEtiapine  100 mg Oral TID   Continuous Infusions:  azithromycin  Stopped (03/01/21 2159)   cefTRIAXone (ROCEPHIN)  IV Stopped (03/01/21 2159)     LOS: 0 days    Time spent: 35 mins,More than 50% of that time was spent in counseling and/or coordination of care.      Burnadette Pop, MD [facility] P12/27/2022, 1:18 PM

## 2021-03-02 NOTE — ED Notes (Signed)
Pt cleaned and changed into a new brief. Ensured that the promofit was working properly

## 2021-03-02 NOTE — ED Notes (Signed)
Pt attempting to sit up and stating he wants to get out of bed/. Pt educated that for his safety he cannot get out of bed. Pt's side rails remain up. Pt educated that he cannot pull at IV lines or any of the cords. Admitting MD notified of pt's agitation and restlessness.

## 2021-03-02 NOTE — ED Notes (Signed)
Oral care preformed, pt suctioned of small amount of thick yellow sputum.  Pt cleaned of moderate amount of stool, new primofit placed and linen changed.  PT requesting bedpan at this time, bedpan placed and wife to call when pt complete.

## 2021-03-02 NOTE — Progress Notes (Signed)
Pt arrived to floor via stretcher around 1420. Pt was 2 assist from stretcher to hospital bed. Pt alert and oriented to person only. VSS. Respirations even and unlabored on room air. Wife at pt's bedside skin. Pt oriented to room. Encouraged to use call bell for assistance. Bed in low position with bed alarm on for pt's safety.

## 2021-03-02 NOTE — Consult Note (Signed)
NEURO HOSPITALIST CONSULT NOTE   Requestig physician: Dr. Loney Loh  Reason for Consult: Confusion, agitation, and failure to thrive in a patient with Parkinson's dementia  History obtained from:  Wife and Chart     HPI:                                                                                                                                          Tyler Fitzpatrick is an 70 y.o. male with a PMHx of Parkinson's dementia, status-post deep brain stimulator placement followed by Dr. Rubin Payor at [facility], REM sleep behavior disorder, sleep apnea, HTN and aortic valve stenosis s/p TAVR, who presented to the ED on Monday with worsened HTN and tachypnea in the setting of pneumonia. He was treated with labetalol, ceftriaxone, azithromycin and IVF in the ED.   Neurology was consulted for worsening Parkinson's disease symptoms together with worsened confusion, agitation and wandering spells.   Per Hospitalist H and P: "Wife states patient has Parkinson's and dementia and things have been going downhill since the beginning of September.  He is very confused at home and agitated, running away from home and was recently found on the street in cold weather.  States things have escalated to the point where patient's caregiver has quit which is making it very difficult for her to take care of him.  He is refusing his medications and when he does take them he starts chewing on them.  Wife is concerned that some of his Parkinson's medications are extended release formulations and the pharmacist told her that if he chewed those drugs, he could overdose.  She has managed to continue giving him his blood pressure medications by mixing them in his food.  States sometimes he chokes when eating food or taking medications but was able to eat breakfast today without any problems.  No fevers reported.  Wife did notice that he is coughing lately.  Patient denies shortness of breath.  Per wife, he had 1  episode of diarrhea/loose stools earlier today, no recurrence.  He is not on Linzess for chronic constipation.  No nausea or vomiting.  Patient denies abdominal pain."   Past Medical History:  Diagnosis Date   Dementia (HCC)    GERD (gastroesophageal reflux disease)    Hypertension    Paranoia (HCC)    Parkinson disease (HCC) 03/08/2011   REM sleep behavior disorder    fights in his sleep   Sleep apnea    uses CPAP    Past Surgical History:  Procedure Laterality Date   BACK SURGERY  1990   CARDIAC CATHETERIZATION  1994   ELBOW SURGERY Left 1994   KIDNEY STONE SURGERY  1987   KNEE ARTHROSCOPY Right    MANDIBLE FRACTURE SURGERY  1973  History reviewed. No pertinent family history.           Social History:  reports that he has quit smoking. His smoking use included cigarettes. His smokeless tobacco use includes chew. He reports that he does not drink alcohol and does not use drugs.  No Known Allergies  MEDICATIONS:                                                                                                                     Home medications:  Rytary (extended-release carbidopa-levodopa) 23.75-95, 3 tablets QID Sinemet 25/100 1 tablet BID (changed from 1 tablet qhs) Seroquel 75 mg po BID Aricept 10 mg qd Klonopin 0.5 mg bid Alprazolam 0.5 mg BID Prednisone 1 mg QD Pravastatin 40 mg QD Metoprolol ER 25 mg BID Lisinopril 40 mg, 1/2 tab BID Depakote ER 250 mg BID (for mood control)   Inpatient Scheduled:  enoxaparin (LOVENOX) injection  40 mg Subcutaneous Q24H   Inpatient Continuous:  azithromycin Stopped (03/01/21 2159)   cefTRIAXone (ROCEPHIN)  IV Stopped (03/01/21 2159)     ROS:                                                                                                                                       As per HPI. Unable to obtain detailed ROS from patient due to nonverbal state.    Blood pressure (!) 172/87, pulse 86, temperature 98 F (36.7  C), resp. rate 18, height 5\' 9"  (1.753 m), weight 103 kg, SpO2 95 %.   General Examination:                                                                                                       Physical Exam  HEENT-  [place]/AT. Increased tone to posterior neck muscles and SCM's.  Lungs- Respirations unlabored Extremities- Warm and well perfused.    Neurological Examination Mental Status: Awakens to voice and light stimulation to a nonverbal state. Not following any commands. Noted to  be picking at an object midway through the exam. No attempts to communicate. Will gaze at examiner's face.  Cranial Nerves: II: Blinks to threat bilaterally. PERRL  III,IV, VI: No ptosis. Eyes are conjugate at the midlline.  V: Weak grimace to brow ridge pressure bilaterally VII: Face is symmetric but expressionless.  VIII: No response to voice IX,X: Unable to assess XI: Head is midline XII: Does not protrude tongue Motor/Sensory: Will move BUE antigravity purposefully when spontaneously picking at an object. Otherwise not following commands for motor testing. Slight movement to light pinch. Cogwheel rigidity to BUE. Mild intermittent tremor noted.  LLE with increased extensor tone. Minimal movement to noxious plantar stimulation.  RLE with decreased tone. Minimal movement to noxious plantar stimulation.  Deep Tendon Reflexes: Hypoactive in the context of increased tone.  Plantars: Right: downgoing   Left: downgoing Cerebellar/Gait: Unable to assess   Lab Results: Basic Metabolic Panel: Recent Labs  Lab 03/01/21 1455  NA 141  K 3.7  CL 105  CO2 26  GLUCOSE 106*  BUN 14  CREATININE 0.83  CALCIUM 8.7*    CBC: Recent Labs  Lab 03/01/21 1455 03/02/21 0517  WBC 14.6* 13.1*  NEUTROABS 11.4*  --   HGB 15.7 15.8  HCT 48.7 47.2  MCV 95.3 94.2  PLT 198 196    Cardiac Enzymes: No results for input(s): CKTOTAL, CKMB, CKMBINDEX, TROPONINI in the last 168 hours.  Lipid Panel: No results for  input(s): CHOL, TRIG, HDL, CHOLHDL, VLDL, LDLCALC in the last 168 hours.  Imaging: CT Head Wo Contrast  Result Date: 03/01/2021 CLINICAL DATA:  Altered mental status EXAM: CT HEAD WITHOUT CONTRAST TECHNIQUE: Contiguous axial images were obtained from the base of the skull through the vertex without intravenous contrast. COMPARISON:  None. FINDINGS: Brain: Left stimulator in place in the region of the thalamus/midbrain. No visible complicating feature. No acute intracranial abnormality. Specifically, no hemorrhage, hydrocephalus, mass lesion, acute infarction, or significant intracranial injury. Vascular: No hyperdense vessel or unexpected calcification. Skull: No acute calvarial abnormality. Sinuses/Orbits: No acute findings Other: None IMPRESSION: No acute intracranial abnormality. Electronically Signed   By: Charlett Nose M.D.   On: 03/01/2021 16:32   CT ABDOMEN PELVIS W CONTRAST  Result Date: 03/01/2021 CLINICAL DATA:  Central generalized abdominal pain, increasing confusion EXAM: CT ABDOMEN AND PELVIS WITH CONTRAST TECHNIQUE: Multidetector CT imaging of the abdomen and pelvis was performed using the standard protocol following bolus administration of intravenous contrast. CONTRAST:  OMNIPAQUE IOHEXOL 300 MG/ML  SOLN COMPARISON:  None. FINDINGS: Lower chest: Scattered atelectasis or scarring at the lung bases. Aortic valve prosthesis. Calcification of the mitral annulus. Hepatobiliary: No focal liver abnormality is seen. No gallstones, gallbladder wall thickening, or biliary dilatation. Pancreas: Unremarkable. No pancreatic ductal dilatation or surrounding inflammatory changes. Spleen: Normal in size without focal abnormality. Adrenals/Urinary Tract: Adrenal glands are unremarkable. Kidneys are normal, without renal calculi, focal lesion, or hydronephrosis. Bladder is unremarkable. Stomach/Bowel: No bowel obstruction or ileus. Normal appendix right lower quadrant. Scattered diverticulosis  throughout the colon without diverticulitis. Moderate fecal retention within the rectal vault. Vascular/Lymphatic: Aortic atherosclerosis. No enlarged abdominal or pelvic lymph nodes. Reproductive: Prostate is unremarkable. Other: No free fluid or free gas.  No abdominal wall hernia. Musculoskeletal: No acute or destructive bony lesions. Minimal anterolisthesis of L5 on S1, with postsurgical changes from discectomy and posterior fusion at that level. Reconstructed images demonstrate no additional findings. IMPRESSION: 1. No acute intra-abdominal or intrapelvic process. 2. Moderate retained stool within the rectal  vault. Please correlate for any signs of fecal impaction. 3. Scattered colonic diverticulosis without diverticulitis. Electronically Signed   By: Sharlet Salina M.D.   On: 03/01/2021 17:26   DG Chest Portable 1 View  Result Date: 03/01/2021 CLINICAL DATA:  Cough EXAM: PORTABLE CHEST 1 VIEW COMPARISON:  None FINDINGS: Left chest wall stimulator battery pack noted. Prior aortic valve repair. Right basilar opacity. Left lung clear. No visible effusions. Heart is normal size. No acute bone abnormality. IMPRESSION: Right basilar airspace opacity concerning for pneumonia. Electronically Signed   By: Charlett Nose M.D.   On: 03/01/2021 16:29     Assessment: 70 year old male with Parkinson's disease and dementia, presenting with confusion, agitation, and failure to thrive.   1. Exam reveals findings most consistent with advanced Parkinson's disease with dementia, possibly with overlapping encephalopathy from pneumonia and possible worsened motor function due to inconsistent medication administration.  2. CT head without acute abnormality.  3. Has DBS in place. Wife has remote on her person and recharges the unit about once per week.  4. Of note, patient's wife states that he recently received a new CPAP machine due to a factory recall of his old one. About two weeks ago, his wife found "black powder" of  unknown composition that seemed to have emanated from the new device while the patient had been wearing it, all over his mouth, face and chest. She is wondering if this could have caused his pneumonia.  5. Outpatient Neurologist is Dr. Shary Decamp, [facility]. Outpatient DBS followups are with Dr. Rubin Payor of [facility]  Recommendations: 1. Given that he can no longer safely take his extended release carbidopa-levodopa due to persistent chewing of his pills, will need to convert back to Sinemet dosing several times per day. Per records in [place], he was formerly on 2 tablets of 25/100 Sinemet PO TID. However, most recent medication information in Care Everywhere reveals that he was prescribed with the extended release formulation (23.75-95) at 3 pills QID, in addition to 1-2 tablets of 25/100 Sinemet per day, for a total levodopa dose per day of about 1340 mg. Likely will need frequent dosing of levodopa, but do not want to dose him too high, which is a possibility if switching from one formulation to another, therefore will restart him on a conservative dosing regimen for Sinemet of two 25/100 tablets QID and titrate upwards with either the dose, the frequency or both while he is observed inpatient.   2. Do not miss doses of his Sinemet as abrupt withdrawal can potentially precipitate neuroleptic malignant syndrome.  3. Avoid sedating medications.  4. Avoid all neuroleptics except for his Seroquel. Haldol and other typical neuroleptics are contraindicated as they can potentially irreversibly worsen Parkinson's symptoms. His Haldol order has been discontinued. Seroquel has been added at one-half of his daily home dosage frequency, but at a slightly higher dose (starting back Seroquel at 100 mg po QHS).  5. Continue clonazepam. Do not abruptly stop his benzodiazepine. Can hold his Xanax as he is covered by the clonazepam dosing.  6. Continue his home donepezil.  7. Continue his home  Depakote (for mood stabilization) 8. PT and OT   Electronically signed: Dr. Caryl Pina 03/02/2021, 6:22 AM

## 2021-03-02 NOTE — Plan of Care (Signed)
  Problem: Education: Goal: Knowledge of General Education information will improve Description: Including pain rating scale, medication(s)/side effects and non-pharmacologic comfort measures Outcome: Not Progressing   Problem: Health Behavior/Discharge Planning: Goal: Ability to manage health-related needs will improve Outcome: Not Progressing   

## 2021-03-02 NOTE — ED Notes (Signed)
Admitting paged about pt's BP

## 2021-03-03 ENCOUNTER — Inpatient Hospital Stay (HOSPITAL_COMMUNITY): Payer: Medicare Other

## 2021-03-03 DIAGNOSIS — I5021 Acute systolic (congestive) heart failure: Secondary | ICD-10-CM

## 2021-03-03 DIAGNOSIS — Z515 Encounter for palliative care: Secondary | ICD-10-CM

## 2021-03-03 DIAGNOSIS — J189 Pneumonia, unspecified organism: Secondary | ICD-10-CM | POA: Diagnosis not present

## 2021-03-03 LAB — BASIC METABOLIC PANEL
Anion gap: 15 (ref 5–15)
BUN: 15 mg/dL (ref 8–23)
CO2: 22 mmol/L (ref 22–32)
Calcium: 8.5 mg/dL — ABNORMAL LOW (ref 8.9–10.3)
Chloride: 105 mmol/L (ref 98–111)
Creatinine, Ser: 1.3 mg/dL — ABNORMAL HIGH (ref 0.61–1.24)
GFR, Estimated: 59 mL/min — ABNORMAL LOW (ref >60)
Glucose, Bld: 143 mg/dL — ABNORMAL HIGH (ref 70–99)
Potassium: 4 mmol/L (ref 3.5–5.1)
Sodium: 142 mmol/L (ref 135–145)

## 2021-03-03 LAB — ECHOCARDIOGRAM COMPLETE
AR max vel: 1.68 cm2
AV Area VTI: 1.97 cm2
AV Area mean vel: 1.87 cm2
AV Mean grad: 12 mmHg
AV Peak grad: 21.7 mmHg
Ao pk vel: 2.33 m/s
Area-P 1/2: 2.77 cm2
Height: 71 in
S' Lateral: 3.15 cm
Weight: 3192.26 oz

## 2021-03-03 LAB — CBC WITH DIFFERENTIAL/PLATELET
Abs Immature Granulocytes: 0.12 10*3/uL — ABNORMAL HIGH (ref 0.00–0.07)
Basophils Absolute: 0 10*3/uL (ref 0.0–0.1)
Basophils Relative: 0 %
Eosinophils Absolute: 0 10*3/uL (ref 0.0–0.5)
Eosinophils Relative: 0 %
HCT: 45.6 % (ref 39.0–52.0)
Hemoglobin: 15.6 g/dL (ref 13.0–17.0)
Immature Granulocytes: 1 %
Lymphocytes Relative: 6 %
Lymphs Abs: 1 10*3/uL (ref 0.7–4.0)
MCH: 31.8 pg (ref 26.0–34.0)
MCHC: 34.2 g/dL (ref 30.0–36.0)
MCV: 92.9 fL (ref 80.0–100.0)
Monocytes Absolute: 1.9 10*3/uL — ABNORMAL HIGH (ref 0.1–1.0)
Monocytes Relative: 10 %
Neutro Abs: 15.1 10*3/uL — ABNORMAL HIGH (ref 1.7–7.7)
Neutrophils Relative %: 83 %
Platelets: 196 10*3/uL (ref 150–400)
RBC: 4.91 MIL/uL (ref 4.22–5.81)
RDW: 13.4 % (ref 11.5–15.5)
WBC: 18.2 10*3/uL — ABNORMAL HIGH (ref 4.0–10.5)
nRBC: 0 % (ref 0.0–0.2)

## 2021-03-03 MED ORDER — LISINOPRIL 10 MG PO TABS
10.0000 mg | ORAL_TABLET | Freq: Every day | ORAL | Status: DC
  Administered 2021-03-03: 10:00:00 10 mg via ORAL
  Filled 2021-03-03: qty 1

## 2021-03-03 MED ORDER — ALPRAZOLAM 0.5 MG PO TABS
0.2500 mg | ORAL_TABLET | Freq: Three times a day (TID) | ORAL | Status: DC
  Administered 2021-03-03: 22:00:00 0.25 mg via ORAL
  Filled 2021-03-03: qty 1

## 2021-03-03 MED ORDER — SODIUM CHLORIDE 0.9 % IV BOLUS
1000.0000 mL | Freq: Once | INTRAVENOUS | Status: AC
  Administered 2021-03-03: 18:00:00 1000 mL via INTRAVENOUS

## 2021-03-03 NOTE — Progress Notes (Signed)
PROGRESS NOTE    Tyler Fitzpatrick  EPP:[id number] DOB: 1951/02/13 DOA: 03/01/2021 PCP: Joya Salm   Chief Complain: Altered mental status, deconditioning  Brief Narrative: Patient is a 70 year old male with history of severe dementia secondary to Parkinson's disease status post brain stimulator, hypertension, GERD, aortic valve stenosis status post TAVR who was brought to the emergency department from home by his wife for the evaluation of rapid decline, increased confusion from baseline, agitation, failure to thrive.  On presentation, he was hypertensive.  Lab work showed elevated leukocytes.  CT showed right basilar opacity concerning for pneumonia.  Started on antibiotics for community-acquired pneumonia.  Due to his rapid decline, initially dementia, wife is interested on pursuing hospice care at home.  TOC, palliative care consulted.Possible dc tomorrow to home with hospice  Assessment & Plan:   Principal Problem:   CAP (community acquired pneumonia) Active Problems:   Sepsis (HCC)   Parkinson's disease (HCC)   Dementia (HCC)   Hypertensive urgency   AMS (altered mental status)   Acute metabolic encephalopathy: Found to be more confused, decreased oral intake, decreased activity from baseline.  Likely multifactorial from pneumonia versus progression of neurological disease.  CT head negative for acute findings.  Delirium precautions.  Community-acquired pneumonia: Chest CT showed right basilar opacity.  Presented with tachypnea, leukocytosis.  Patient not hypoxic, on room air.  COVID/flu test negative.  Started on ceftriaxone at admission.    Possibility of aspiration pneumonia.  Speech therapy consulted ,started on dysphagia 2 diet  End-stage dementia/Parkinson's disease: Follows with neurologist at [facility].  Status post deep brain stimulation.  Confused at baseline.  Can ambulate short distances but lying in the bed most of the time.  Being taken care of at home by wife.   Significant decline since last 2 weeks with inability to ambulate, poor oral intake, more confused. Started on Sinemet as per neurology.  He is on high-dose Seroquel at home, Depakote, Xanax: Restarted.  Hypertensive urgency:  Blood pressure better after restarting his home medications.  OSA: Previously on CPAP at home  Constipation: Continue bowel regimen  Bilateral lower extremity edema: Normal  BNP, given iv lasix one dose with improvement in the edema. Check echo,no echo on file  Goals of care: Extensive discussion done with the wife at the bedside about goals of care.  She is not able to take care of him anymore.  She has been trying to set of hospice care for him either at home or even at residential hospice.  Goal is for comfort but will continue antibiotics in order to treat for pneumonia.  I have put palliative care care consultation, TOC consultation to set up hospice care at least at home.  If he declines, wife agreeable for residential hospice           DVT prophylaxis:Lovenox Code Status: DNR Family Communication: Wife at bedside today Patient status:  Dispo: The patient is from: Home              Anticipated d/c is to: Home with Hospice vs residential hospice              Anticipated d/c date is: tomorrow  Consultants: None  Procedures: None  Antimicrobials:  Anti-infectives (From admission, onward)    Start     Dose/Rate Route Frequency Ordered Stop   03/01/21 1745  cefTRIAXone (ROCEPHIN) 2 g in sodium chloride 0.9 % 100 mL IVPB        2 g 200 mL/hr over 30  Minutes Intravenous Every 24 hours 03/01/21 1744 03/06/21 1744   03/01/21 1745  azithromycin (ZITHROMAX) 500 mg in sodium chloride 0.9 % 250 mL IVPB        500 mg 250 mL/hr over 60 Minutes Intravenous Every 24 hours 03/01/21 1744 03/06/21 1744       Subjective:   Patient seen and examined at the bedside this morning.  Hemodynamically stable.  Blood pressure better today.  He looks better and  comfortable than yesterday.  Sitting on the bed.  Being fed by speech therapist.  He communicated and obeys commands but not oriented  Objective: Vitals:   03/02/21 1606 03/02/21 1940 03/02/21 2310 03/03/21 0400  BP: (!) 166/94 (!) 160/102 (!) 158/92 111/72  Pulse: 90 93    Resp: 17 20 (!) 25 (!) 23  Temp: 97.8 F (36.6 C) 98 F (36.7 C) 98 F (36.7 C) 97.8 F (36.6 C)  TempSrc: Oral Oral Oral Axillary  SpO2: 93%   95%  Weight:      Height:       No intake or output data in the 24 hours ending 03/03/21 0808 Filed Weights   03/01/21 1454 03/02/21 1434  Weight: 103 kg 90.5 kg    Examination:   General exam: Very deconditioned, chronically ill looking HEENT: PERRL Respiratory system:  no wheezes or crackles  Cardiovascular system: S1 & S2 heard, RRR.  Gastrointestinal system: Abdomen is nondistended, soft and nontender. Central nervous system: Alert and awake but not oriented Extremities: No edema, no clubbing ,no cyanosis Skin: No rashes, no ulcers,no icterus      Data Reviewed: I have personally reviewed following labs and imaging studies  CBC: Recent Labs  Lab 03/01/21 1455 03/02/21 0517 03/03/21 0123  WBC 14.6* 13.1* 18.2*  NEUTROABS 11.4*  --  15.1*  HGB 15.7 15.8 15.6  HCT 48.7 47.2 45.6  MCV 95.3 94.2 92.9  PLT 198 196 196   Basic Metabolic Panel: Recent Labs  Lab 03/01/21 1455 03/03/21 0123  NA 141 142  K 3.7 4.0  CL 105 105  CO2 26 22  GLUCOSE 106* 143*  BUN 14 15  CREATININE 0.83 1.30*  CALCIUM 8.7* 8.5*   GFR: Estimated Creatinine Clearance: 60.9 mL/min (A) (by C-G formula based on SCr of 1.3 mg/dL (H)). Liver Function Tests: Recent Labs  Lab 03/01/21 1455  AST 24  ALT <5  ALKPHOS 84  BILITOT 0.9  PROT 6.6  ALBUMIN 3.3*   No results for input(s): LIPASE, AMYLASE in the last 168 hours. No results for input(s): AMMONIA in the last 168 hours. Coagulation Profile: Recent Labs  Lab 03/01/21 1833  INR 1.0   Cardiac Enzymes: No  results for input(s): CKTOTAL, CKMB, CKMBINDEX, TROPONINI in the last 168 hours. BNP (last 3 results) No results for input(s): PROBNP in the last 8760 hours. HbA1C: No results for input(s): HGBA1C in the last 72 hours. CBG: No results for input(s): GLUCAP in the last 168 hours. Lipid Profile: No results for input(s): CHOL, HDL, LDLCALC, TRIG, CHOLHDL, LDLDIRECT in the last 72 hours. Thyroid Function Tests: No results for input(s): TSH, T4TOTAL, FREET4, T3FREE, THYROIDAB in the last 72 hours. Anemia Panel: No results for input(s): VITAMINB12, FOLATE, FERRITIN, TIBC, IRON, RETICCTPCT in the last 72 hours. Sepsis Labs: Recent Labs  Lab 03/01/21 1754 03/01/21 1833  LATICACIDVEN 1.4 1.5    Recent Results (from the past 240 hour(s))  Resp Panel by RT-PCR (Flu A&B, Covid) Nasopharyngeal Swab     Status: None  Collection Time: 03/01/21  3:45 PM   Specimen: Nasopharyngeal Swab; Nasopharyngeal(NP) swabs in vial transport medium  Result Value Ref Range Status   SARS Coronavirus 2 by RT PCR NEGATIVE NEGATIVE Final    Comment: (NOTE) SARS-CoV-2 target nucleic acids are NOT DETECTED.  The SARS-CoV-2 RNA is generally detectable in upper respiratory specimens during the acute phase of infection. The lowest concentration of SARS-CoV-2 viral copies this assay can detect is 138 copies/mL. A negative result does not preclude SARS-Cov-2 infection and should not be used as the sole basis for treatment or other patient management decisions. A negative result may occur with  improper specimen collection/handling, submission of specimen other than nasopharyngeal swab, presence of viral mutation(s) within the areas targeted by this assay, and inadequate number of viral copies(<138 copies/mL). A negative result must be combined with clinical observations, patient history, and epidemiological information. The expected result is Negative.  Fact Sheet for Patients:   BloggerCourse.com  Fact Sheet for Healthcare Providers:  SeriousBroker.it  This test is no t yet approved or cleared by the [place] FDA and  has been authorized for detection and/or diagnosis of SARS-CoV-2 by FDA under an Emergency Use Authorization (EUA). This EUA will remain  in effect (meaning this test can be used) for the duration of the COVID-19 declaration under Section 564(b)(1) of the Act, 21 U.S.C.section 360bbb-3(b)(1), unless the authorization is terminated  or revoked sooner.       Influenza A by PCR NEGATIVE NEGATIVE Final   Influenza B by PCR NEGATIVE NEGATIVE Final    Comment: (NOTE) The Xpert Xpress SARS-CoV-2/FLU/RSV plus assay is intended as an aid in the diagnosis of influenza from Nasopharyngeal swab specimens and should not be used as a sole basis for treatment. Nasal washings and aspirates are unacceptable for Xpert Xpress SARS-CoV-2/FLU/RSV testing.  Fact Sheet for Patients: BloggerCourse.com  Fact Sheet for Healthcare Providers: SeriousBroker.it  This test is not yet approved or cleared by the [place] FDA and has been authorized for detection and/or diagnosis of SARS-CoV-2 by FDA under an Emergency Use Authorization (EUA). This EUA will remain in effect (meaning this test can be used) for the duration of the COVID-19 declaration under Section 564(b)(1) of the Act, 21 U.S.C. section 360bbb-3(b)(1), unless the authorization is terminated or revoked.  Performed at [facility], [address], [place], [place] [address]   Urine Culture     Status: None   Collection Time: 03/01/21  3:54 PM   Specimen: Urine, Clean Catch  Result Value Ref Range Status   Specimen Description URINE, CLEAN CATCH  Final   Special Requests NONE  Final   Culture   Final    NO GROWTH Performed at [facility], [address] [place], [place]  [address]    Report Status 03/02/2021 FINAL  Final  Blood Culture (routine x 2)     Status: None (Preliminary result)   Collection Time: 03/01/21  6:30 PM   Specimen: BLOOD RIGHT WRIST  Result Value Ref Range Status   Specimen Description BLOOD RIGHT WRIST  Final   Special Requests   Final    BOTTLES DRAWN AEROBIC AND ANAEROBIC Blood Culture results may not be optimal due to an inadequate volume of blood received in culture bottles   Culture   Final    NO GROWTH 2 DAYS Performed at [facility], [address] [place], [place] [address]    Report Status PENDING  Incomplete  Blood Culture (  routine x 2)     Status: None (Preliminary result)   Collection Time: 03/01/21  6:34 PM   Specimen: BLOOD RIGHT HAND  Result Value Ref Range Status   Specimen Description BLOOD RIGHT HAND  Final   Special Requests   Final    BOTTLES DRAWN AEROBIC AND ANAEROBIC Blood Culture adequate volume   Culture   Final    NO GROWTH 2 DAYS Performed at [facility], [address] [place], [place] [address]    Report Status PENDING  Incomplete         Radiology Studies: CT Head Wo Contrast  Result Date: 03/01/2021 CLINICAL DATA:  Altered mental status EXAM: CT HEAD WITHOUT CONTRAST TECHNIQUE: Contiguous axial images were obtained from the base of the skull through the vertex without intravenous contrast. COMPARISON:  None. FINDINGS: Brain: Left stimulator in place in the region of the thalamus/midbrain. No visible complicating feature. No acute intracranial abnormality. Specifically, no hemorrhage, hydrocephalus, mass lesion, acute infarction, or significant intracranial injury. Vascular: No hyperdense vessel or unexpected calcification. Skull: No acute calvarial abnormality. Sinuses/Orbits: No acute findings Other: None IMPRESSION: No acute intracranial abnormality. Electronically Signed   By: Charlett Nose M.D.   On: 03/01/2021 16:32   CT ABDOMEN PELVIS W CONTRAST  Result Date:  03/01/2021 CLINICAL DATA:  Central generalized abdominal pain, increasing confusion EXAM: CT ABDOMEN AND PELVIS WITH CONTRAST TECHNIQUE: Multidetector CT imaging of the abdomen and pelvis was performed using the standard protocol following bolus administration of intravenous contrast. CONTRAST:  OMNIPAQUE IOHEXOL 300 MG/ML  SOLN COMPARISON:  None. FINDINGS: Lower chest: Scattered atelectasis or scarring at the lung bases. Aortic valve prosthesis. Calcification of the mitral annulus. Hepatobiliary: No focal liver abnormality is seen. No gallstones, gallbladder wall thickening, or biliary dilatation. Pancreas: Unremarkable. No pancreatic ductal dilatation or surrounding inflammatory changes. Spleen: Normal in size without focal abnormality. Adrenals/Urinary Tract: Adrenal glands are unremarkable. Kidneys are normal, without renal calculi, focal lesion, or hydronephrosis. Bladder is unremarkable. Stomach/Bowel: No bowel obstruction or ileus. Normal appendix right lower quadrant. Scattered diverticulosis throughout the colon without diverticulitis. Moderate fecal retention within the rectal vault. Vascular/Lymphatic: Aortic atherosclerosis. No enlarged abdominal or pelvic lymph nodes. Reproductive: Prostate is unremarkable. Other: No free fluid or free gas.  No abdominal wall hernia. Musculoskeletal: No acute or destructive bony lesions. Minimal anterolisthesis of L5 on S1, with postsurgical changes from discectomy and posterior fusion at that level. Reconstructed images demonstrate no additional findings. IMPRESSION: 1. No acute intra-abdominal or intrapelvic process. 2. Moderate retained stool within the rectal vault. Please correlate for any signs of fecal impaction. 3. Scattered colonic diverticulosis without diverticulitis. Electronically Signed   By: Sharlet Salina M.D.   On: 03/01/2021 17:26   DG Chest Portable 1 View  Result Date: 03/01/2021 CLINICAL DATA:  Cough EXAM: PORTABLE CHEST 1 VIEW  COMPARISON:  None FINDINGS: Left chest wall stimulator battery pack noted. Prior aortic valve repair. Right basilar opacity. Left lung clear. No visible effusions. Heart is normal size. No acute bone abnormality. IMPRESSION: Right basilar airspace opacity concerning for pneumonia. Electronically Signed   By: Charlett Nose M.D.   On: 03/01/2021 16:29        Scheduled Meds:  ALPRAZolam  0.5 mg Oral TID   carbidopa-levodopa  2 tablet Oral QID   divalproex  250 mg Oral BID   donepezil  10 mg Oral QHS   enoxaparin (LOVENOX) injection  40 mg Subcutaneous Q24H   latanoprost  1 drop Both  Eyes QHS   lisinopril  10 mg Oral Daily   metoprolol succinate  25 mg Oral BID   pantoprazole  40 mg Oral Daily   pravastatin  40 mg Oral Daily   QUEtiapine  100 mg Oral TID   Continuous Infusions:  azithromycin 500 mg (03/02/21 1808)   cefTRIAXone (ROCEPHIN)  IV Stopped (03/02/21 1800)     LOS: 1 day    Time spent: 35 mins,More than 50% of that time was spent in counseling and/or coordination of care.      Burnadette Pop, MD [facility] P12/28/2022, 8:08 AM

## 2021-03-03 NOTE — Evaluation (Signed)
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 male who presented to the ED for evaluation of confusion, agitation, and failure to thrive. CXR on admission: Right basilar airspace opacity concerning for pneumonia. CT head negative. Dx metabolic encephalopathy. Per referring MD's note on 12/27, pt's wife's goal is comfort, but will continue to treat pneumonia. 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    Assessment / Plan / Recommendation  Clinical Impression  Pt was seen for bedside swallow evaluation with his wife present who reported intermittent coughing during p.o. intake. Pt's wife stated that the pt has been demonstrating coughing and choking with solids and liquids at home; she denied any significant variability with consistency. Pt was unable to follow the necessary commands for a complete oral mechanism exam. No s/sx of aspiration were noted with solids or liqiuds. However, pt exhibited symptoms of a cognitively-base dysphagia characterized by reduced bolus  awareness, prolonged mastication, and prolonged bolus manipulation. Considering the reports of pt's wife, SLP suspects that these factors lead to intermittent aspiration during meals. A dysphagia 2 diet with thin liquids is recommended at this time with observance of swallowing precautions. SLP will follow pt. SLP Visit Diagnosis: Dysphagia, unspecified (R13.10)    Aspiration Risk  Mild aspiration risk;Moderate aspiration risk    Diet Recommendation Dysphagia 2 (Fine chop);Thin liquid   Liquid Administration via: Cup;Straw Medication Administration: Crushed with puree Supervision: Full supervision/cueing for compensatory strategies (staff to feed pt) Compensations: Slow rate;Small sips/bites (reduce bolus sizes) Postural Changes: Seated upright at 90 degrees    Other  Recommendations Oral Care Recommendations: Oral care BID    Recommendations for follow up therapy are one component of a multi-disciplinary discharge planning process, led by the attending physician.  Recommendations may be updated based on patient status, additional functional criteria and insurance authorization.  Follow up Recommendations No SLP follow up      Assistance Recommended at Discharge None  Functional Status Assessment Patient has not had a recent decline in their functional status  Frequency and Duration min 1 x/week  1 week       Prognosis        Swallow Study   General Date of Onset: 03/02/21 HPI: Pt is a 70 y.o. male who presented to the ED for evaluation of confusion, agitation, and failure to thrive. CXR on admission: Right basilar airspace opacity concerning for pneumonia. CT head negative. Dx metabolic encephalopathy. Per referring MD's note on 12/27, pt's wife's goal  is comfort, but will continue to treat pneumonia. 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 Type of Study: Bedside Swallow Evaluation Previous Swallow Assessment: none Diet Prior to this Study: Thin liquids (clear liquids) Temperature Spikes Noted: No Respiratory Status: Room air History of Recent Intubation: No Behavior/Cognition: Alert;Requires cueing;Confused Oral Cavity Assessment: Within Functional Limits Oral Care Completed by SLP: No Patient Positioning: Upright in bed;Postural control adequate for testing Baseline Vocal Quality: Normal Volitional Cough: Cognitively unable to elicit Volitional Swallow: Unable to elicit    Oral/Motor/Sensory Function Overall Oral Motor/Sensory Function: Within functional limits   Ice Chips Ice chips: Within functional limits Presentation: Spoon   Thin Liquid Thin Liquid: Within functional limits Presentation: Straw    Nectar Thick Nectar Thick Liquid: Not tested   Honey Thick Honey Thick Liquid: Not tested   Puree Puree: Within functional limits Presentation: Spoon   Solid     Solid: Impaired Presentation: Spoon Oral Phase Impairments: Impaired mastication;Poor awareness of bolus Oral Phase Functional Implications: Impaired mastication;Prolonged oral transit     Sheanna Dail I. Vear Clock, MS, CCC-SLP Acute Rehabilitation Services Office number [phone] Pager [phone]  Scheryl Marten 03/03/2021,9:37 AM

## 2021-03-03 NOTE — Progress Notes (Signed)
Pt still has mitts on, holding off CPAP for that reason

## 2021-03-03 NOTE — Progress Notes (Signed)
Speech Language Pathology Treatment: Dysphagia  Patient Details Name: Tyler Fitzpatrick MRN: [id number] DOB: [birth year] Today's Date: 03/03/2021 Time: 7858-8502 SLP Time Calculation (min) (ACUTE ONLY): 26 min  Assessment / Plan / Recommendation Clinical Impression  Pt was seen for dysphagia treatment. Pt's wife expressed concerns regarding her feeding the pt and his potentially aspirating. Pt's wife was educated regarding the nature of pt's dysphagia, the importance of oral care, and compensatory strategies to reduce the risk of adverse dysphagia-related events. Strategies included, but were not limited to, reducing bolus size, upright positioning, reduced intake rate, use of an intermittent liquid wash, and ensuring oral clearance. Pt's wife verbalized understanding, but admitted that her head was "spinning" and that she was having difficulty absorbing all the information that she's received since admission. Pt was observed to independently use reduced bolus sizes and consistently modified dysphagia 3 solids from her tray to a dysphagia 2 level. Intermittent cues were needed to reduce intake rate. Brief delayed coughing was noted at the end of the session. Pt's current diet of dysphagia 2 solids and thin liquids will be continued as this appears to be his baseline. However, considering GOC being that of comfort, pt may also have other solids as well if he wishes. SLP will follow briefly per wife's request to facilitate family education.    HPI HPI: Pt is a 70 y.o. male who presented to the ED for evaluation of confusion, agitation, and failure to thrive. CXR on admission: Right basilar airspace opacity concerning for pneumonia. CT head negative. Dx metabolic encephalopathy. Per referring MD's note on 12/27, pt's wife's goal is comfort, but will continue to treat pneumonia. 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      SLP Plan  Continue with current plan of care      Recommendations for follow up therapy are one component of a multi-disciplinary discharge planning process, led by the attending physician.  Recommendations may be updated based on patient status, additional functional criteria and insurance authorization.    Recommendations  Diet recommendations: Dysphagia 2 (fine chop);Thin liquid Liquids provided via: Straw;Cup Medication Administration: Crushed with puree Supervision: Patient able to self feed Compensations: Slow rate;Small sips/bites (reduce bolus sizes) Postural Changes and/or Swallow Maneuvers: Seated upright 90 degrees;Upright 30-60 min after meal                Oral Care Recommendations: Oral care BID Follow Up Recommendations: No SLP follow up Assistance recommended at discharge: None SLP Visit Diagnosis: Dysphagia, unspecified (R13.10) Plan: Continue with current plan of care         Tyler Fitzpatrick I. Tyler Clock, MS, CCC-SLP Acute Rehabilitation Services Office number [phone] Pager [phone]   Tyler Fitzpatrick  03/03/2021, 10:14 AM

## 2021-03-03 NOTE — Progress Notes (Signed)
Brief Palliative Medicine Progress Note:  PMT consult received and chart reviewed.   Noted patient's wife was agreeable to home hospice services, which has been set up by TOC. Reached out to Dr. Renford Dills who states there are no further PMT needs at this time as goals are clear.   Thank you for allowing PMT to assist in the care of this patient.  Nyeemah Jennette M. Katrinka Blazing [facility] Palliative Medicine Team Team Phone: [phone] NO CHARGE

## 2021-03-03 NOTE — TOC Initial Note (Signed)
Transition of Care [facility]) - Initial/Assessment Note    Patient Details  Name: Tyler Fitzpatrick MRN: [id number] Date of Birth: 1950/08/08  Transition [facility]) CM/SW Contact:    Tyler Masson, RN Phone Number: 03/03/2021, 1:26 PM  Clinical Narrative:                 Spoke to patient's wife, Tyler Fitzpatrick, regarding transition needs. Home hospice ordered. Wife defers to [facility] to choose home hospice agency. Spoke to [place] with [facility] and referral accepted.Tyler Fitzpatrick the liaison for Tyler Fitzpatrick  has spoken to wife and ordered hospital bed.Patient will need PTAR transportation home. TOC will continue to follow  Expected Discharge Plan: Home w Hospice Care Barriers to Discharge: Continued Medical Work up   Patient Goals and CMS Choice Patient states their goals for this hospitalization and ongoing recovery are:: wife wants to take patient home CMS Medicare.gov Compare Post Acute Care list provided to:: Patient Represenative (must comment) Choice offered to / list presented to : Spouse  Expected Discharge Plan and Services Expected Discharge Plan: Home w Hospice Care In-house Referral: Clinical Social Work, Hospice / Palliative Care Discharge Planning Services: CM Consult Post Acute Care Choice: Hospice Living arrangements for the past 2 months: Single Family Home                                      Prior Living Arrangements/Services Living arrangements for the past 2 months: Single Family Home Lives with:: Spouse Patient language and need for interpreter reviewed:: Yes        Need for Family Participation in Patient Care: Yes (Comment) Care giver support system in place?: Yes (comment)   Criminal Activity/Legal Involvement Pertinent to Current Situation/Hospitalization: No - Comment as needed  Activities of Daily Living Home Assistive Devices/Equipment: None ADL Screening (condition at time of admission) Patient's cognitive ability adequate to safely complete daily  activities?: No Is the patient deaf or have difficulty hearing?: No Does the patient have difficulty seeing, even when wearing glasses/contacts?: No Does the patient have difficulty concentrating, remembering, or making decisions?: Yes Patient able to express need for assistance with ADLs?: Yes Does the patient have difficulty dressing or bathing?: Yes Independently performs ADLs?: No Communication: Independent Grooming: Independent Feeding: Independent Bathing: Needs assistance Is this a change from baseline?: Change from baseline, expected to last >3 days Toileting: Needs assistance Is this a change from baseline?: Change from baseline, expected to last >3days In/Out Bed: Needs assistance Is this a change from baseline?: Change from baseline, expected to last >3 days Walks in Home: Needs assistance Is this a change from baseline?: Change from baseline, expected to last >3 days Does the patient have difficulty walking or climbing stairs?: Yes Weakness of Legs: Both Weakness of Arms/Hands: None  Permission Sought/Granted Permission sought to share information with : Photographer granted to share info w AGENCY: hospice        Emotional Assessment Appearance:: Appears older than stated age       Alcohol / Substance Use: Not Applicable Psych Involvement: No (comment)  Admission diagnosis:  CAP (community acquired pneumonia) [J18.9] Community acquired pneumonia, unspecified laterality [J18.9] AMS (altered mental status) [R41.82] Sepsis, due to unspecified organism, unspecified whether acute organ dysfunction present [facility]) [A41.9] Patient Active Problem List   Diagnosis Date Noted   AMS (altered mental status) 03/02/2021   CAP (community  acquired pneumonia) 03/01/2021   Sepsis (HCC) 03/01/2021   Parkinson's disease (HCC) 03/01/2021   Dementia (HCC) 03/01/2021   Hypertensive urgency 03/01/2021   Spondylolisthesis at L5-S1 level  01/07/2014   PCP:  Tyler Fitzpatrick Pharmacy:   [facility] [phone] [place], [place] - [address] [facility] [address] [place] [address] Phone: [phone] Fax: [phone]     Social Determinants of Health (SDOH) Interventions    Readmission Risk Interventions No flowsheet data found.

## 2021-03-03 NOTE — Progress Notes (Signed)
Echocardiogram 2D Echocardiogram has been performed.  Tyler Fitzpatrick RDCS 03/03/2021, 11:22 AM

## 2021-03-04 ENCOUNTER — Inpatient Hospital Stay (HOSPITAL_COMMUNITY): Payer: Medicare Other

## 2021-03-04 DIAGNOSIS — J189 Pneumonia, unspecified organism: Secondary | ICD-10-CM | POA: Diagnosis not present

## 2021-03-04 LAB — CBC WITH DIFFERENTIAL/PLATELET
Abs Immature Granulocytes: 0.22 10*3/uL — ABNORMAL HIGH (ref 0.00–0.07)
Basophils Absolute: 0.1 10*3/uL (ref 0.0–0.1)
Basophils Relative: 0 %
Eosinophils Absolute: 0.1 10*3/uL (ref 0.0–0.5)
Eosinophils Relative: 0 %
HCT: 41.4 % (ref 39.0–52.0)
Hemoglobin: 13.5 g/dL (ref 13.0–17.0)
Immature Granulocytes: 1 %
Lymphocytes Relative: 6 %
Lymphs Abs: 1.5 10*3/uL (ref 0.7–4.0)
MCH: 31.3 pg (ref 26.0–34.0)
MCHC: 32.6 g/dL (ref 30.0–36.0)
MCV: 95.8 fL (ref 80.0–100.0)
Monocytes Absolute: 3.4 10*3/uL — ABNORMAL HIGH (ref 0.1–1.0)
Monocytes Relative: 13 %
Neutro Abs: 21.5 10*3/uL — ABNORMAL HIGH (ref 1.7–7.7)
Neutrophils Relative %: 80 %
Platelets: 189 10*3/uL (ref 150–400)
RBC: 4.32 MIL/uL (ref 4.22–5.81)
RDW: 14.4 % (ref 11.5–15.5)
WBC: 26.7 10*3/uL — ABNORMAL HIGH (ref 4.0–10.5)
nRBC: 0 % (ref 0.0–0.2)

## 2021-03-04 LAB — BASIC METABOLIC PANEL
Anion gap: 12 (ref 5–15)
BUN: 31 mg/dL — ABNORMAL HIGH (ref 8–23)
CO2: 21 mmol/L — ABNORMAL LOW (ref 22–32)
Calcium: 8 mg/dL — ABNORMAL LOW (ref 8.9–10.3)
Chloride: 108 mmol/L (ref 98–111)
Creatinine, Ser: 3.65 mg/dL — ABNORMAL HIGH (ref 0.61–1.24)
GFR, Estimated: 17 mL/min — ABNORMAL LOW (ref >60)
Glucose, Bld: 137 mg/dL — ABNORMAL HIGH (ref 70–99)
Potassium: 4.2 mmol/L (ref 3.5–5.1)
Sodium: 141 mmol/L (ref 135–145)

## 2021-03-04 LAB — PROCALCITONIN: Procalcitonin: 0.59 ng/mL

## 2021-03-04 MED ORDER — ENOXAPARIN SODIUM 30 MG/0.3ML IJ SOSY: 30.0000 mg | PREFILLED_SYRINGE | INTRAMUSCULAR | Status: DC

## 2021-03-04 MED ORDER — SODIUM CHLORIDE 0.9 % IV SOLN: INTRAVENOUS | Status: DC

## 2021-03-04 MED ORDER — ALPRAZOLAM 0.5 MG PO TABS
0.2500 mg | ORAL_TABLET | Freq: Three times a day (TID) | ORAL | Status: DC | PRN
  Administered 2021-03-04: 14:00:00 0.25 mg via ORAL
  Filled 2021-03-04: qty 1

## 2021-03-04 MED ORDER — QUETIAPINE FUMARATE 100 MG PO TABS: 100.0000 mg | ORAL_TABLET | Freq: Every day | ORAL | Status: DC

## 2021-03-04 MED ORDER — TAMSULOSIN HCL 0.4 MG PO CAPS: 0.4000 mg | ORAL_CAPSULE | Freq: Every day | ORAL | 0 refills | Status: AC

## 2021-03-04 MED ORDER — ALPRAZOLAM 0.5 MG PO TABS: 0.5000 mg | ORAL_TABLET | Freq: Three times a day (TID) | ORAL | 0 refills | Status: AC | PRN

## 2021-03-04 MED ORDER — CARBIDOPA-LEVODOPA 25-100 MG PO TABS
2.0000 | ORAL_TABLET | Freq: Four times a day (QID) | ORAL | 0 refills | Status: AC
Start: 2021-03-04 — End: 2021-04-03

## 2021-03-04 MED ORDER — QUETIAPINE FUMARATE 25 MG PO TABS
25.0000 mg | ORAL_TABLET | Freq: Every day | ORAL | 0 refills | Status: AC
Start: 2021-03-04 — End: ?

## 2021-03-04 MED ORDER — AMOXICILLIN-POT CLAVULANATE 875-125 MG PO TABS: 1.0000 | ORAL_TABLET | Freq: Two times a day (BID) | ORAL | 0 refills | Status: AC

## 2021-03-04 NOTE — Progress Notes (Signed)
Speech Language Pathology Treatment: Dysphagia  Patient Details Name: Tyler Fitzpatrick MRN: [id number] DOB: 1950-06-08 Today's Date: 03/04/2021 Time: 4098-1191 SLP Time Calculation (min) (ACUTE ONLY): 10 min  Assessment / Plan / Recommendation Clinical Impression  Pt seen for follow-up dysphagia treatment primarily to finalize education. Pt lethargic upon SLP arrival, but responded verbally to basic questions with eyes closed. Wife at bedside reports pt expectorating pills/food and with overall poor intake. No overt s/sx of aspiration noted by wife with POs during meals. Repositioned pt upright in bed and he followed simple commands to take single sips of thin liquids via straw, exhibiting no overt s/sx of aspiration. Pt's lethargy limited trials this date, however suspect that with implementation of swallow precautions, pt risk for aspiration can be reduced. Educated wife in regards to offering small bites/sips at slow rate, positioning pt upright for POs, ensuring pt is alert for all intake and intermittent liquid wash in order to increase oral clearance of solids. Encouraged wife to check for oral clearance of POs during/after meals. Wife expressed understanding and agreement with all recommendations. Recommend continue with current diet at this time. He may have other solids/consistencies as he desires given transition to hospice care. No further SLP f/u warranted. Will s/o.    HPI HPI: Pt is a 70 y.o. male who presented to the ED for evaluation of confusion, agitation, and failure to thrive. CXR on admission: Right basilar airspace opacity concerning for pneumonia. CT head negative. Dx metabolic encephalopathy. Per referring MD's note on 12/27, pt's wife's goal is comfort, but will continue to treat pneumonia. 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      SLP Plan  Discharge SLP treatment due to (comment);All goals met      Recommendations for follow up therapy are one component of a multi-disciplinary discharge planning process, led by the attending physician.  Recommendations may be updated based on patient status, additional functional criteria and insurance authorization.    Recommendations  Diet recommendations: Dysphagia 2 (fine chop);Thin liquid Liquids provided via: Straw;Cup Medication Administration: Crushed with puree Supervision: Staff to assist with self feeding;Full supervision/cueing for compensatory strategies Compensations: Slow rate;Small sips/bites;Other (Comment);Minimize environmental distractions;Follow solids with liquid (reduce bolus sizes) Postural Changes and/or Swallow Maneuvers: Seated upright 90 degrees;Upright 30-60 min after meal                Oral Care Recommendations: Oral care BID Follow Up Recommendations: No SLP follow up Assistance recommended at discharge: None SLP Visit Diagnosis: Dysphagia, unspecified (R13.10) Plan: Discharge SLP treatment due to (comment);All goals met          Ellwood Dense, [place], [place] Office Number: [phone]  Acie Fredrickson  03/04/2021, 12:03 PM

## 2021-03-04 NOTE — Discharge Summary (Signed)
Physician Discharge Summary  Tyler Fitzpatrick IRW:[id number] DOB: [birth year] DOA: 03/01/2021  PCP: Joya Salm  Admit date: 03/01/2021 Discharge date: 03/04/2021  Admitted From: Home Disposition:  Home  Discharge Condition:Stable CODE STATUS: DNR Diet recommendation: Dysphagia 2  Brief/Interim Summary:  Patient is a 69 year old male with history of severe dementia secondary to Parkinson's disease status post brain stimulator, hypertension, GERD, aortic valve stenosis status post TAVR who was brought to the emergency department from home by his wife for the evaluation of rapid decline, increased confusion from baseline, agitation, failure to thrive.  On presentation, he was hypertensive.  Lab work showed elevated leukocytes.  CT showed right basilar opacity concerning for pneumonia.  Started on antibiotics for community-acquired pneumonia.  Due to his rapid decline, initially dementia, wife is interested on pursuing hospice care at home.  TOC, palliative care consulted.  Hospice care has been set up  at home.  Will be discharged today.  Following problems were addressed during his hospitalization:  Acute metabolic encephalopathy: Found to be more confused, decreased oral intake, decreased activity from baseline.  Likely multifactorial from pneumonia versus progression of neurological disease.  CT head negative for acute findings.     Community-acquired pneumonia: Chest CT showed right basilar opacity.  Presented with tachypnea, leukocytosis.  Patient was not hypoxic on arrival , on room air.  COVID/flu test negative.  Started on ceftriaxone /azithro at admission.    Possibility of aspiration pneumonia.  Speech therapy consulted ,started on dysphagia 2 diet. Leukocytosis worsened today.  Cultures have been negative so far.  He is afebrile.  Procalcitonin is nonreassuring. antibiotic changed to Augmentin for 5 more days to cover for aspiration pneumonia.  AKI: New problem.  Creatinine went up to  the range of 3 today.  Foley placed immediately with return of more than 2 L of urine which is dark-colored.  Patient will be discharged with Foley catheter.  Started on Flomax   End-stage dementia/Parkinson's disease: Follows with neurologist at [facility].  Status post deep brain stimulation.  Confused at baseline.  Can ambulate short distances but lying in the bed most of the time.  Being taken care of at home by wife.  Significant decline since last 2 weeks with inability to ambulate, poor oral intake, more confused. Started on Sinemet as per neurology.  He is on high-dose Seroquel at home, Depakote, Xanax: Resumed at lower dose on discharge.  He is very sleepy   Hypertensive urgency: Became hypotensive on 12/28.  Antihypertensives discontinued   OSA: Previously on CPAP at home   Bilateral lower extremity edema: Normal  BNP, given iv lasix one dose with improvement in the edema.  Echo done here showed EF of 60 to 65%, grade 1 diastolic dysfunction.   Goals of care: Extensive discussion about goals of care.  She is not able to take care of him anymore.  She has been trying to set of hospice care for him either at home or even at residential hospice.  Goal is for comfort .  Hospice care has been set up at home. Again had a long discussion with the wife at bedside today.  Looks like he is declining.  His white cell count went up, creatinine went up.  I gave the wife the choices either to keep him in the hospital and treat to improve his kidney function or take him home because the goal is for hospice/comfort.  After family discussion, wife wanted to take him home today  Discharge Diagnoses:  Principal  Problem:   CAP (community acquired pneumonia) Active Problems:   Sepsis (HCC)   Parkinson's disease (HCC)   Dementia (HCC)   Hypertensive urgency   AMS (altered mental status)    Discharge Instructions  Discharge Instructions     Diet general   Complete by: As directed    Dysphagia 2    Discharge instructions   Complete by: As directed    1)Please take the medications as instructed 2)Follow up with hospice services   Increase activity slowly   Complete by: As directed       Allergies as of 03/04/2021   No Known Allergies      Medication List     STOP taking these medications    celecoxib 200 MG capsule Commonly known as: CELEBREX   clonazePAM 0.5 MG tablet Commonly known as: KLONOPIN   diazepam 5 MG tablet Commonly known as: Valium   divalproex 250 MG 24 hr tablet Commonly known as: DEPAKOTE ER   linaclotide 145 MCG Caps capsule Commonly known as: LINZESS   lisinopril 40 MG tablet Commonly known as: ZESTRIL   metoprolol succinate 25 MG 24 hr tablet Commonly known as: TOPROL-XL   omeprazole 20 MG capsule Commonly known as: PRILOSEC   predniSONE 1 MG tablet Commonly known as: DELTASONE       TAKE these medications    acetaminophen 500 MG tablet Commonly known as: TYLENOL Take 1,000 mg by mouth every 6 (six) hours as needed for mild pain.   ALPRAZolam 0.5 MG tablet Commonly known as: XANAX Take 1 tablet (0.5 mg total) by mouth 3 (three) times daily as needed for anxiety. What changed:  when to take this reasons to take this   amoxicillin-clavulanate 875-125 MG tablet Commonly known as: Augmentin Take 1 tablet by mouth 2 (two) times daily for 5 days.   aspirin EC 81 MG tablet Take 81 mg by mouth daily. Swallow whole.   carbidopa-levodopa 25-100 MG tablet Commonly known as: SINEMET IR Take 2 tablets by mouth 4 (four) times daily. What changed:  how much to take when to take this additional instructions Another medication with the same name was removed. Continue taking this medication, and follow the directions you see here.   cetirizine 10 MG tablet Commonly known as: ZYRTEC Take 10 mg by mouth daily.   donepezil 10 MG tablet Commonly known as: ARICEPT Take 10 mg by mouth every evening. After supper   ketoconazole 2 %  shampoo Commonly known as: NIZORAL Apply 1 application topically 2 (two) times daily as needed for itching.   latanoprost 0.005 % ophthalmic solution Commonly known as: XALATAN Place 1 drop into both eyes at bedtime.   pantoprazole 40 MG tablet Commonly known as: PROTONIX Take 40 mg by mouth daily.   pravastatin 40 MG tablet Commonly known as: PRAVACHOL Take 40 mg by mouth daily.   QUEtiapine 25 MG tablet Commonly known as: SEROQUEL Take 1 tablet (25 mg total) by mouth at bedtime. What changed:  medication strength how much to take when to take this   tamsulosin 0.4 MG Caps capsule Commonly known as: Flomax Take 1 capsule (0.4 mg total) by mouth daily.        No Known Allergies  Consultations: None   Procedures/Studies: CT Head Wo Contrast  Result Date: 03/01/2021 CLINICAL DATA:  Altered mental status EXAM: CT HEAD WITHOUT CONTRAST TECHNIQUE: Contiguous axial images were obtained from the base of the skull through the vertex without intravenous contrast. COMPARISON:  None. FINDINGS:  Brain: Left stimulator in place in the region of the thalamus/midbrain. No visible complicating feature. No acute intracranial abnormality. Specifically, no hemorrhage, hydrocephalus, mass lesion, acute infarction, or significant intracranial injury. Vascular: No hyperdense vessel or unexpected calcification. Skull: No acute calvarial abnormality. Sinuses/Orbits: No acute findings Other: None IMPRESSION: No acute intracranial abnormality. Electronically Signed   By: Charlett Nose M.D.   On: 03/01/2021 16:32   CT ABDOMEN PELVIS W CONTRAST  Result Date: 03/01/2021 CLINICAL DATA:  Central generalized abdominal pain, increasing confusion EXAM: CT ABDOMEN AND PELVIS WITH CONTRAST TECHNIQUE: Multidetector CT imaging of the abdomen and pelvis was performed using the standard protocol following bolus administration of intravenous contrast. CONTRAST:  OMNIPAQUE IOHEXOL 300 MG/ML  SOLN  COMPARISON:  None. FINDINGS: Lower chest: Scattered atelectasis or scarring at the lung bases. Aortic valve prosthesis. Calcification of the mitral annulus. Hepatobiliary: No focal liver abnormality is seen. No gallstones, gallbladder wall thickening, or biliary dilatation. Pancreas: Unremarkable. No pancreatic ductal dilatation or surrounding inflammatory changes. Spleen: Normal in size without focal abnormality. Adrenals/Urinary Tract: Adrenal glands are unremarkable. Kidneys are normal, without renal calculi, focal lesion, or hydronephrosis. Bladder is unremarkable. Stomach/Bowel: No bowel obstruction or ileus. Normal appendix right lower quadrant. Scattered diverticulosis throughout the colon without diverticulitis. Moderate fecal retention within the rectal vault. Vascular/Lymphatic: Aortic atherosclerosis. No enlarged abdominal or pelvic lymph nodes. Reproductive: Prostate is unremarkable. Other: No free fluid or free gas.  No abdominal wall hernia. Musculoskeletal: No acute or destructive bony lesions. Minimal anterolisthesis of L5 on S1, with postsurgical changes from discectomy and posterior fusion at that level. Reconstructed images demonstrate no additional findings. IMPRESSION: 1. No acute intra-abdominal or intrapelvic process. 2. Moderate retained stool within the rectal vault. Please correlate for any signs of fecal impaction. 3. Scattered colonic diverticulosis without diverticulitis. Electronically Signed   By: Sharlet Salina M.D.   On: 03/01/2021 17:26   DG CHEST PORT 1 VIEW  Result Date: 03/04/2021 CLINICAL DATA:  Right basilar opacity concerning for pneumonia on prior CT EXAM: PORTABLE CHEST 1 VIEW COMPARISON:  CT abdomen/pelvis 03/01/2021, chest radiograph 03/01/2021 FINDINGS: Heart is at the upper limits of normal for size. An aortic valve prosthesis is again noted. The mediastinum is prominent, likely exaggerated by low lung volumes and AP technique. A left chest wall stimulator is again  noted. There are patchy opacities in the lung bases favored to reflect atelectasis. Aeration is not significantly changed since 03/01/2021. The lungs are otherwise clear. There is no significant pleural effusion. There is no pneumothorax. There is no acute osseous abnormality. IMPRESSION: Patchy opacities in the lung bases is favored to reflect atelectasis. Overall, aeration is not significantly changed since 03/01/2021. Electronically Signed   By: Lesia Hausen M.D.   On: 03/04/2021 09:57   DG Chest Portable 1 View  Result Date: 03/01/2021 CLINICAL DATA:  Cough EXAM: PORTABLE CHEST 1 VIEW COMPARISON:  None FINDINGS: Left chest wall stimulator battery pack noted. Prior aortic valve repair. Right basilar opacity. Left lung clear. No visible effusions. Heart is normal size. No acute bone abnormality. IMPRESSION: Right basilar airspace opacity concerning for pneumonia. Electronically Signed   By: Charlett Nose M.D.   On: 03/01/2021 16:29   ECHOCARDIOGRAM COMPLETE  Result Date: 03/03/2021    ECHOCARDIOGRAM REPORT   Patient Name:   Tyler Fitzpatrick Reiger Date of Exam: 03/03/2021 Medical Rec #:  [id number]      Height:       71.0 in Accession #:    [id number]  Weight:       199.5 lb Date of Birth:  15-Mar-1950       BSA:          2.106 m Patient Age:    70 years       BP:           147/82 mmHg Patient Gender: M              HR:           93 bpm. Exam Location:  Inpatient Procedure: 2D Echo, Color Doppler and Cardiac Doppler Indications:    I50.21 Acute systolic (congestive) heart failure  History:        Patient has prior history of Echocardiogram examinations, most                 recent 01/15/2021. Signs/Symptoms:Parkinson's; Risk                 Factors:Hypertension and Sleep Apnea. Prior echo and TAVR                 procedure performed at [facility].                 10/14/19 29mm Edwards S3U TAVR implanted.  Sonographer:    Irving Burton Senior RDCS Referring Phys: [phone] Creola Krotz IMPRESSIONS  1. Left ventricular ejection  fraction, by estimation, is 60 to 65%. The left ventricle has normal function. The left ventricle has no regional wall motion abnormalities. Left ventricular diastolic parameters are consistent with Grade I diastolic dysfunction (impaired relaxation). Elevated left ventricular end-diastolic pressure.  2. Right ventricular systolic function is normal. The right ventricular size is normal.  3. The mitral valve is normal in structure. Trivial mitral valve regurgitation. No evidence of mitral stenosis.  4. No significant changes in the aortic valve gradients since the echo at [facility] 01/2021. The aortic valve has been repaired/replaced. Aortic valve regurgitation is not visualized. No aortic stenosis is present. Echo findings are consistent with normal structure and function of the aortic valve prosthesis. Aortic valve area, by VTI measures 1.97 cm. Aortic valve mean gradient measures 12.0 mmHg. Aortic valve Vmax measures 2.33 m/s.  5. Aortic dilatation noted. There is mild dilatation of the aortic root, measuring 37 mm.  6. The inferior vena cava is normal in size with <50% respiratory variability, suggesting right atrial pressure of 8 mmHg. FINDINGS  Left Ventricle: Left ventricular ejection fraction, by estimation, is 60 to 65%. The left ventricle has normal function. The left ventricle has no regional wall motion abnormalities. The left ventricular internal cavity size was normal in size. There is  no left ventricular hypertrophy. Left ventricular diastolic parameters are consistent with Grade I diastolic dysfunction (impaired relaxation). Elevated left ventricular end-diastolic pressure. Right Ventricle: The right ventricular size is normal. No increase in right ventricular wall thickness. Right ventricular systolic function is normal. Left Atrium: Left atrial size was normal in size. Right Atrium: Right atrial size was normal in size. Pericardium: There is no evidence of pericardial effusion. Mitral Valve: The  mitral valve is normal in structure. Trivial mitral valve regurgitation. No evidence of mitral valve stenosis. Tricuspid Valve: The tricuspid valve is normal in structure. Tricuspid valve regurgitation is not demonstrated. No evidence of tricuspid stenosis. Aortic Valve: No significant changes in the aortic valve gradients since the echo at [facility] 01/2021. The aortic valve has been repaired/replaced. Aortic valve regurgitation is not visualized. No aortic stenosis is present. Aortic valve mean gradient measures 12.0 mmHg.  Aortic valve peak gradient measures 21.7 mmHg. Aortic valve area, by VTI measures 1.97 cm. There is a 29 mm Sapien prosthetic, stented (TAVR) valve present in the aortic position. Echo findings are consistent with normal structure and function of the aortic valve prosthesis. Pulmonic Valve: The pulmonic valve was normal in structure. Pulmonic valve regurgitation is not visualized. No evidence of pulmonic stenosis. Aorta: Aortic dilatation noted. There is mild dilatation of the aortic root, measuring 37 mm. Venous: The inferior vena cava is normal in size with less than 50% respiratory variability, suggesting right atrial pressure of 8 mmHg. IAS/Shunts: No atrial level shunt detected by color flow Doppler.  LEFT VENTRICLE PLAX 2D LVIDd:         4.85 cm   Diastology LVIDs:         3.15 cm   LV e' medial:    4.03 cm/s LV PW:         1.30 cm   LV E/e' medial:  21.8 LV IVS:        1.23 cm   LV e' lateral:   4.79 cm/s LVOT diam:     2.50 cm   LV E/e' lateral: 18.4 LV SV:         69 LV SV Index:   33 LVOT Area:     4.91 cm  LEFT ATRIUM             Index        RIGHT ATRIUM           Index LA diam:        3.80 cm 1.80 cm/m   RA Area:     22.50 cm LA Vol (A2C):   53.5 ml 25.40 ml/m  RA Volume:   59.00 ml  28.01 ml/m LA Vol (A4C):   48.4 ml 22.98 ml/m LA Biplane Vol: 54.4 ml 25.83 ml/m  AORTIC VALVE AV Area (Vmax):    1.68 cm AV Area (Vmean):   1.87 cm AV Area (VTI):     1.97 cm AV Vmax:            233.00 cm/s AV Vmean:          162.000 cm/s AV VTI:            0.349 m AV Peak Grad:      21.7 mmHg AV Mean Grad:      12.0 mmHg LVOT Vmax:         79.80 cm/s LVOT Vmean:        61.700 cm/s LVOT VTI:          0.140 m LVOT/AV VTI ratio: 0.40  AORTA Ao Root diam: 3.70 cm MITRAL VALVE MV Area (PHT): 2.77 cm     SHUNTS MV Decel Time: 274 msec     Systemic VTI:  0.14 m MV E velocity: 88.00 cm/s   Systemic Diam: 2.50 cm MV A velocity: 144.00 cm/s MV E/A ratio:  0.61 Chilton Si MD Electronically signed by Chilton Si MD Signature Date/Time: 03/03/2021/2:41:26 PM    Final       Subjective:  Patient seen and examined at the bedside this morning.  Hemodynamically stable during my evaluation.  Was sleepy, did not wake up on calling his name.  Foley was placed with return of more than 2 L of dark urine.  Wife at the bedside.  Wife expresses desire to take him home today  Discharge Exam: Vitals:   03/04/21 0700 03/04/21 0758  BP: 126/73 133/83  Pulse:  99  Resp: (!) 30 (!) 24  Temp:  99.3 F (37.4 C)  SpO2: 93% 94%   Vitals:   03/04/21 0500 03/04/21 0600 03/04/21 0700 03/04/21 0758  BP: 114/67 130/80 126/73 133/83  Pulse: 96 97  99  Resp: (!) 22 (!) 24 (!) 30 (!) 24  Temp:    99.3 F (37.4 C)  TempSrc:    Oral  SpO2: 95% 95% 93% 94%  Weight:      Height:        General: Ill looking, deconditioned, lying in bed, obese cardiovascular: RRR, S1/S2 +, no rubs, no gallops Respiratory: Diminished air sounds on bases, no wheezing, no rhonchi Abdominal: Soft, NT, ND, bowel sounds + Extremities: no edema, no cyanosis    The results of significant diagnostics from this hospitalization (including imaging, microbiology, ancillary and laboratory) are listed below for reference.     Microbiology: Recent Results (from the past 240 hour(s))  Resp Panel by RT-PCR (Flu A&B, Covid) Nasopharyngeal Swab     Status: None   Collection Time: 03/01/21  3:45 PM   Specimen: Nasopharyngeal Swab;  Nasopharyngeal(NP) swabs in vial transport medium  Result Value Ref Range Status   SARS Coronavirus 2 by RT PCR NEGATIVE NEGATIVE Final    Comment: (NOTE) SARS-CoV-2 target nucleic acids are NOT DETECTED.  The SARS-CoV-2 RNA is generally detectable in upper respiratory specimens during the acute phase of infection. The lowest concentration of SARS-CoV-2 viral copies this assay can detect is 138 copies/mL. A negative result does not preclude SARS-Cov-2 infection and should not be used as the sole basis for treatment or other patient management decisions. A negative result may occur with  improper specimen collection/handling, submission of specimen other than nasopharyngeal swab, presence of viral mutation(s) within the areas targeted by this assay, and inadequate number of viral copies(<138 copies/mL). A negative result must be combined with clinical observations, patient history, and epidemiological information. The expected result is Negative.  Fact Sheet for Patients:  BloggerCourse.com  Fact Sheet for Healthcare Providers:  SeriousBroker.it  This test is no t yet approved or cleared by the [place] FDA and  has been authorized for detection and/or diagnosis of SARS-CoV-2 by FDA under an Emergency Use Authorization (EUA). This EUA will remain  in effect (meaning this test can be used) for the duration of the COVID-19 declaration under Section 564(b)(1) of the Act, 21 U.S.C.section 360bbb-3(b)(1), unless the authorization is terminated  or revoked sooner.       Influenza A by PCR NEGATIVE NEGATIVE Final   Influenza B by PCR NEGATIVE NEGATIVE Final    Comment: (NOTE) The Xpert Xpress SARS-CoV-2/FLU/RSV plus assay is intended as an aid in the diagnosis of influenza from Nasopharyngeal swab specimens and should not be used as a sole basis for treatment. Nasal washings and aspirates are unacceptable for Xpert Xpress  SARS-CoV-2/FLU/RSV testing.  Fact Sheet for Patients: BloggerCourse.com  Fact Sheet for Healthcare Providers: SeriousBroker.it  This test is not yet approved or cleared by the [place] FDA and has been authorized for detection and/or diagnosis of SARS-CoV-2 by FDA under an Emergency Use Authorization (EUA). This EUA will remain in effect (meaning this test can be used) for the duration of the COVID-19 declaration under Section 564(b)(1) of the Act, 21 U.S.C. section 360bbb-3(b)(1), unless the authorization is terminated or revoked.  Performed at [facility], [address], [place], [place] [address]   Urine Culture     Status: None  Collection Time: 03/01/21  3:54 PM   Specimen: Urine, Clean Catch  Result Value Ref Range Status   Specimen Description URINE, CLEAN CATCH  Final   Special Requests NONE  Final   Culture   Final    NO GROWTH Performed at [facility], [address], [place], [place] [address]    Report Status 03/02/2021 FINAL  Final  Blood Culture (routine x 2)     Status: None (Preliminary result)   Collection Time: 03/01/21  6:30 PM   Specimen: BLOOD RIGHT WRIST  Result Value Ref Range Status   Specimen Description BLOOD RIGHT WRIST  Final   Special Requests   Final    BOTTLES DRAWN AEROBIC AND ANAEROBIC Blood Culture results may not be optimal due to an inadequate volume of blood received in culture bottles   Culture   Final    NO GROWTH 3 DAYS Performed at [facility], [address] [place], [place] [address]    Report Status PENDING  Incomplete  Blood Culture (routine x 2)     Status: None (Preliminary result)   Collection Time: 03/01/21  6:34 PM   Specimen: BLOOD RIGHT HAND  Result Value Ref Range Status   Specimen Description BLOOD RIGHT HAND  Final   Special Requests   Final    BOTTLES DRAWN AEROBIC AND ANAEROBIC Blood Culture adequate volume   Culture   Final    NO  GROWTH 3 DAYS Performed at [facility], [address] [place], [place] [address]    Report Status PENDING  Incomplete     Labs: BNP (last 3 results) Recent Labs    03/02/21 1454  BNP 93.1   Basic Metabolic Panel: Recent Labs  Lab 03/01/21 1455 03/03/21 0123 03/04/21 0143  NA 141 142 141  K 3.7 4.0 4.2  CL 105 105 108  CO2 26 22 21*  GLUCOSE 106* 143* 137*  BUN 14 15 31*  CREATININE 0.83 1.30* 3.65*  CALCIUM 8.7* 8.5* 8.0*   Liver Function Tests: Recent Labs  Lab 03/01/21 1455  AST 24  ALT <5  ALKPHOS 84  BILITOT 0.9  PROT 6.6  ALBUMIN 3.3*   No results for input(s): LIPASE, AMYLASE in the last 168 hours. No results for input(s): AMMONIA in the last 168 hours. CBC: Recent Labs  Lab 03/01/21 1455 03/02/21 0517 03/03/21 0123 03/04/21 0143  WBC 14.6* 13.1* 18.2* 26.7*  NEUTROABS 11.4*  --  15.1* 21.5*  HGB 15.7 15.8 15.6 13.5  HCT 48.7 47.2 45.6 41.4  MCV 95.3 94.2 92.9 95.8  PLT 198 196 196 189   Cardiac Enzymes: No results for input(s): CKTOTAL, CKMB, CKMBINDEX, TROPONINI in the last 168 hours. BNP: Invalid input(s): POCBNP CBG: No results for input(s): GLUCAP in the last 168 hours. D-Dimer No results for input(s): DDIMER in the last 72 hours. Hgb A1c No results for input(s): HGBA1C in the last 72 hours. Lipid Profile No results for input(s): CHOL, HDL, LDLCALC, TRIG, CHOLHDL, LDLDIRECT in the last 72 hours. Thyroid function studies No results for input(s): TSH, T4TOTAL, T3FREE, THYROIDAB in the last 72 hours.  Invalid input(s): FREET3 Anemia work up No results for input(s): VITAMINB12, FOLATE, FERRITIN, TIBC, IRON, RETICCTPCT in the last 72 hours. Urinalysis    Component Value Date/Time   COLORURINE AMBER (A) 03/01/2021 1455   APPEARANCEUR CLOUDY (A) 03/01/2021 1455   LABSPEC >1.030 (H) 03/01/2021 1455   PHURINE 5.5 03/01/2021 1455   GLUCOSEU NEGATIVE 03/01/2021  1455   HGBUR NEGATIVE 03/01/2021 1455   BILIRUBINUR NEGATIVE  03/01/2021 1455   KETONESUR 5 (A) 03/01/2021 1455   PROTEINUR 30 (A) 03/01/2021 1455   NITRITE NEGATIVE 03/01/2021 1455   LEUKOCYTESUR NEGATIVE 03/01/2021 1455   Sepsis Labs Invalid input(s): PROCALCITONIN,  WBC,  LACTICIDVEN Microbiology Recent Results (from the past 240 hour(s))  Resp Panel by RT-PCR (Flu A&B, Covid) Nasopharyngeal Swab     Status: None   Collection Time: 03/01/21  3:45 PM   Specimen: Nasopharyngeal Swab; Nasopharyngeal(NP) swabs in vial transport medium  Result Value Ref Range Status   SARS Coronavirus 2 by RT PCR NEGATIVE NEGATIVE Final    Comment: (NOTE) SARS-CoV-2 target nucleic acids are NOT DETECTED.  The SARS-CoV-2 RNA is generally detectable in upper respiratory specimens during the acute phase of infection. The lowest concentration of SARS-CoV-2 viral copies this assay can detect is 138 copies/mL. A negative result does not preclude SARS-Cov-2 infection and should not be used as the sole basis for treatment or other patient management decisions. A negative result may occur with  improper specimen collection/handling, submission of specimen other than nasopharyngeal swab, presence of viral mutation(s) within the areas targeted by this assay, and inadequate number of viral copies(<138 copies/mL). A negative result must be combined with clinical observations, patient history, and epidemiological information. The expected result is Negative.  Fact Sheet for Patients:  BloggerCourse.com  Fact Sheet for Healthcare Providers:  SeriousBroker.it  This test is no t yet approved or cleared by the [place] FDA and  has been authorized for detection and/or diagnosis of SARS-CoV-2 by FDA under an Emergency Use Authorization (EUA). This EUA will remain  in effect (meaning this test can be used) for the duration of the COVID-19 declaration under Section 564(b)(1) of the Act, 21 U.S.C.section 360bbb-3(b)(1),  unless the authorization is terminated  or revoked sooner.       Influenza A by PCR NEGATIVE NEGATIVE Final   Influenza B by PCR NEGATIVE NEGATIVE Final    Comment: (NOTE) The Xpert Xpress SARS-CoV-2/FLU/RSV plus assay is intended as an aid in the diagnosis of influenza from Nasopharyngeal swab specimens and should not be used as a sole basis for treatment. Nasal washings and aspirates are unacceptable for Xpert Xpress SARS-CoV-2/FLU/RSV testing.  Fact Sheet for Patients: BloggerCourse.com  Fact Sheet for Healthcare Providers: SeriousBroker.it  This test is not yet approved or cleared by the [place] FDA and has been authorized for detection and/or diagnosis of SARS-CoV-2 by FDA under an Emergency Use Authorization (EUA). This EUA will remain in effect (meaning this test can be used) for the duration of the COVID-19 declaration under Section 564(b)(1) of the Act, 21 U.S.C. section 360bbb-3(b)(1), unless the authorization is terminated or revoked.  Performed at [facility], [address], [place], [place] [address]   Urine Culture     Status: None   Collection Time: 03/01/21  3:54 PM   Specimen: Urine, Clean Catch  Result Value Ref Range Status   Specimen Description URINE, CLEAN CATCH  Final   Special Requests NONE  Final   Culture   Final    NO GROWTH Performed at [facility], [address] [place], [place] [address]    Report Status 03/02/2021 FINAL  Final  Blood Culture (routine x 2)     Status: None (Preliminary result)   Collection Time: 03/01/21  6:30 PM   Specimen: BLOOD RIGHT WRIST  Result Value Ref Range Status   Specimen  Description BLOOD RIGHT WRIST  Final   Special Requests   Final    BOTTLES DRAWN AEROBIC AND ANAEROBIC Blood Culture results may not be optimal due to an inadequate volume of blood received in culture bottles   Culture   Final    NO GROWTH 3 DAYS Performed at [facility], [address] [place], [place] [address]    Report Status PENDING  Incomplete  Blood Culture (routine x 2)     Status: None (Preliminary result)   Collection Time: 03/01/21  6:34 PM   Specimen: BLOOD RIGHT HAND  Result Value Ref Range Status   Specimen Description BLOOD RIGHT HAND  Final   Special Requests   Final    BOTTLES DRAWN AEROBIC AND ANAEROBIC Blood Culture adequate volume   Culture   Final    NO GROWTH 3 DAYS Performed at [facility], [address] [place], [place] [address]    Report Status PENDING  Incomplete    Please note: You were cared for by a hospitalist during your hospital stay. Once you are discharged, your primary care physician will handle any further medical issues. Please note that NO REFILLS for any discharge medications will be authorized once you are discharged, as it is imperative that you return to your primary care physician (or establish a relationship with a primary care physician if you do not have one) for your post hospital discharge needs so that they can reassess your need for medications and monitor your lab values.    Time coordinating discharge: 40 minutes  SIGNED:   Burnadette Pop, MD  [facility] 03/04/2021, 10:39 AM Pager [phone]  If 7PM-7AM, please contact night-coverage [email or web address] Password [email or web address]

## 2021-03-04 NOTE — Plan of Care (Signed)
Neurology will sign out due to family's wishes for Hospice.  Call if questions.   Jimmye Norman, MSN, APN-BC Neurology Nurse Practitioner Pager [phone]

## 2021-03-04 NOTE — Progress Notes (Signed)
Pt's wife given discharge instructions, prescriptions, and care notes. She verbalized understanding AEB no further questions or concerns at this time. IV was discontinued, no redness, pain, or swelling noted at this time. Telemetry discontinued and Centralized Telemetry was notified. Pt left the floor via PTAR in stable condition.

## 2021-03-06 LAB — CULTURE, BLOOD (ROUTINE X 2)
Culture: NO GROWTH
Culture: NO GROWTH
Special Requests: ADEQUATE

## 2021-04-07 DEATH — deceased
